# Patient Record
Sex: Female | Born: 1985 | Race: Black or African American | Hispanic: Refuse to answer | Marital: Single | Smoking: Former smoker
Health system: Southern US, Community
[De-identification: ages and names within clinical notes are randomized; demographics above are authoritative.]

## PROBLEM LIST (undated history)

## (undated) DIAGNOSIS — G43909 Migraine, unspecified, not intractable, without status migrainosus: Secondary | ICD-10-CM

## (undated) DIAGNOSIS — Z8489 Family history of other specified conditions: Secondary | ICD-10-CM

## (undated) DIAGNOSIS — K219 Gastro-esophageal reflux disease without esophagitis: Secondary | ICD-10-CM

## (undated) DIAGNOSIS — J45909 Unspecified asthma, uncomplicated: Secondary | ICD-10-CM

## (undated) DIAGNOSIS — F32A Depression, unspecified: Secondary | ICD-10-CM

## (undated) DIAGNOSIS — F329 Major depressive disorder, single episode, unspecified: Secondary | ICD-10-CM

## (undated) DIAGNOSIS — F419 Anxiety disorder, unspecified: Secondary | ICD-10-CM

## (undated) DIAGNOSIS — Z9189 Other specified personal risk factors, not elsewhere classified: Secondary | ICD-10-CM

## (undated) DIAGNOSIS — S060XAA Concussion with loss of consciousness status unknown, initial encounter: Secondary | ICD-10-CM

## (undated) DIAGNOSIS — Z803 Family history of malignant neoplasm of breast: Secondary | ICD-10-CM

## (undated) DIAGNOSIS — S060X9A Concussion with loss of consciousness of unspecified duration, initial encounter: Secondary | ICD-10-CM

## (undated) DIAGNOSIS — D649 Anemia, unspecified: Secondary | ICD-10-CM

## (undated) DIAGNOSIS — I10 Essential (primary) hypertension: Secondary | ICD-10-CM

## (undated) HISTORY — PX: TONSILLECTOMY: SHX5217

## (undated) HISTORY — DX: Family history of malignant neoplasm of breast: Z80.3

## (undated) HISTORY — PX: TONSILLECTOMY: SUR1361

## (undated) HISTORY — DX: Other specified personal risk factors, not elsewhere classified: Z91.89

## (undated) HISTORY — DX: Essential (primary) hypertension: I10

## (undated) HISTORY — PX: LIVER BIOPSY: SHX301

---

## 2008-09-30 ENCOUNTER — Emergency Department (HOSPITAL_COMMUNITY): Admission: EM | Admit: 2008-09-30 | Discharge: 2008-09-30 | Payer: Self-pay | Admitting: Emergency Medicine

## 2010-05-14 LAB — CBC
HCT: 43.3 % (ref 36.0–46.0)
MCHC: 33.3 g/dL (ref 30.0–36.0)
MCV: 89.1 fL (ref 78.0–100.0)
Platelets: 192 10*3/uL (ref 150–400)
RBC: 4.86 MIL/uL (ref 3.87–5.11)
WBC: 5.6 10*3/uL (ref 4.0–10.5)

## 2010-05-14 LAB — POCT PREGNANCY, URINE: Preg Test, Ur: NEGATIVE

## 2010-05-14 LAB — GC/CHLAMYDIA PROBE AMP, GENITAL
Chlamydia, DNA Probe: NEGATIVE
GC Probe Amp, Genital: NEGATIVE

## 2010-05-14 LAB — WET PREP, GENITAL
Trich, Wet Prep: NONE SEEN
Yeast Wet Prep HPF POC: NONE SEEN

## 2010-12-17 ENCOUNTER — Emergency Department: Payer: Self-pay | Admitting: Emergency Medicine

## 2011-01-06 ENCOUNTER — Emergency Department: Payer: Self-pay | Admitting: Emergency Medicine

## 2011-03-07 ENCOUNTER — Emergency Department: Payer: Self-pay | Admitting: Emergency Medicine

## 2011-07-21 ENCOUNTER — Emergency Department: Payer: Self-pay | Admitting: Emergency Medicine

## 2011-07-21 LAB — URINALYSIS, COMPLETE
Bilirubin,UR: NEGATIVE
RBC,UR: 6 /HPF (ref 0–5)
Specific Gravity: 1.017 (ref 1.003–1.030)
WBC UR: 32 /HPF (ref 0–5)

## 2012-04-17 ENCOUNTER — Emergency Department: Payer: Self-pay | Admitting: Emergency Medicine

## 2012-08-04 ENCOUNTER — Emergency Department: Payer: Self-pay | Admitting: Emergency Medicine

## 2012-08-04 LAB — URINALYSIS, COMPLETE
Bilirubin,UR: NEGATIVE
Glucose,UR: NEGATIVE mg/dL (ref 0–75)
Nitrite: POSITIVE
Ph: 6 (ref 4.5–8.0)
Specific Gravity: 1.011 (ref 1.003–1.030)
Squamous Epithelial: 3

## 2012-08-05 LAB — GC/CHLAMYDIA PROBE AMP

## 2012-12-10 ENCOUNTER — Emergency Department: Payer: Self-pay | Admitting: Emergency Medicine

## 2012-12-15 ENCOUNTER — Emergency Department: Payer: Self-pay | Admitting: Emergency Medicine

## 2012-12-15 LAB — COMPREHENSIVE METABOLIC PANEL
Alkaline Phosphatase: 91 U/L (ref 50–136)
BUN: 12 mg/dL (ref 7–18)
Bilirubin,Total: 0.2 mg/dL (ref 0.2–1.0)
Chloride: 105 mmol/L (ref 98–107)
Co2: 30 mmol/L (ref 21–32)
Creatinine: 0.85 mg/dL (ref 0.60–1.30)
Glucose: 101 mg/dL — ABNORMAL HIGH (ref 65–99)
Osmolality: 276 (ref 275–301)
SGOT(AST): 21 U/L (ref 15–37)

## 2012-12-15 LAB — CBC
HCT: 39.1 % (ref 35.0–47.0)
MCH: 29.9 pg (ref 26.0–34.0)
MCHC: 34.1 g/dL (ref 32.0–36.0)
RDW: 13.8 % (ref 11.5–14.5)
WBC: 8.9 10*3/uL (ref 3.6–11.0)

## 2013-02-19 ENCOUNTER — Emergency Department: Payer: Self-pay | Admitting: Internal Medicine

## 2013-03-07 ENCOUNTER — Emergency Department: Payer: Self-pay | Admitting: Emergency Medicine

## 2013-03-09 ENCOUNTER — Emergency Department: Payer: Self-pay

## 2013-05-08 ENCOUNTER — Emergency Department: Payer: Self-pay | Admitting: Emergency Medicine

## 2013-07-21 ENCOUNTER — Ambulatory Visit: Payer: Self-pay | Admitting: Orthopaedic Surgery

## 2013-09-04 ENCOUNTER — Emergency Department: Payer: Self-pay

## 2013-12-21 ENCOUNTER — Emergency Department: Payer: Self-pay | Admitting: Emergency Medicine

## 2014-04-12 ENCOUNTER — Emergency Department: Payer: Self-pay | Admitting: Emergency Medicine

## 2014-10-16 ENCOUNTER — Emergency Department: Payer: Self-pay

## 2014-10-16 ENCOUNTER — Encounter: Payer: Self-pay | Admitting: Emergency Medicine

## 2014-10-16 ENCOUNTER — Emergency Department
Admission: EM | Admit: 2014-10-16 | Discharge: 2014-10-16 | Disposition: A | Discharge status: Home / Self Care | Payer: Self-pay | Attending: Student | Admitting: Student

## 2014-10-16 DIAGNOSIS — M79605 Pain in left leg: Secondary | ICD-10-CM | POA: Insufficient documentation

## 2014-10-16 DIAGNOSIS — Z91040 Latex allergy status: Secondary | ICD-10-CM | POA: Insufficient documentation

## 2014-10-16 DIAGNOSIS — Z79899 Other long term (current) drug therapy: Secondary | ICD-10-CM | POA: Insufficient documentation

## 2014-10-16 DIAGNOSIS — Z720 Tobacco use: Secondary | ICD-10-CM | POA: Insufficient documentation

## 2014-10-16 HISTORY — DX: Migraine, unspecified, not intractable, without status migrainosus: G43.909

## 2014-10-16 MED ORDER — IBUPROFEN 800 MG PO TABS: 800.0000 mg | ORAL_TABLET | Freq: Three times a day (TID) | ORAL | Status: DC | PRN

## 2014-10-16 MED ORDER — CYCLOBENZAPRINE HCL 10 MG PO TABS: 10.0000 mg | ORAL_TABLET | Freq: Three times a day (TID) | ORAL | Status: DC | PRN
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
## 2014-10-16 NOTE — Discharge Instructions (Signed)
Patellofemoral Syndrome If you have had pain in the front of your knee for a long time, chances are good that you have patellofemoral syndrome. The word patella refers to the kneecap. Femoral (or femur) refers to the thigh bone. That is the bone the kneecap sits on. The kneecap is shaped like a triangle. Its job is to protect the knee and to improve the efficiency of your thigh muscles (quadriceps). The underside of the kneecap is made of smooth tissue (cartilage). This lets the kneecap slide up and down as the knee moves. Sometimes this cartilage becomes soft. Your healthcare provider may say the cartilage breaks down. That is patellofemoral syndrome. It can affect one knee, or both. The condition is sometimes called patellofemoral pain syndrome. That is because the condition is painful. The pain usually gets worse with activity. Sitting for a long time with the knee bent also makes the pain worse. It usually gets better with rest and proper treatment. CAUSES  No one is sure why some people develop this problem and others do not. Runners often get it. One name for the condition is "runner's knee." However, some people run for years and never have knee pain. Certain things seem to make patellofemoral syndrome more likely. They include:  Moving out of alignment. The kneecap is supposed to move in a straight line when the thigh muscle pulls on it. Sometimes the kneecap moves in poor alignment. That can make the knee swell and hurt. Some experts believe it also wears down the cartilage.  Injury to the kneecap.  Strain on the knee. This may occur during sports activity. Soccer, running, skiing and cycling can put excess stress on the knee.  Being flat-footed or knock-kneed. SYMPTOMS   Knee pain.  Pain under the kneecap. This is usually a dull, aching pain.  Pain in the knee when doing certain things: squatting, kneeling, going up or down stairs.  Pain in the knee when you stand up after sitting down  for awhile.  Tightness in the knee.  Loss of muscle strength in the thigh.  Swelling of the knee. DIAGNOSIS  Healthcare providers often send people with knee pain to an orthopedic caregiver. This person has special training to treat problems with bones and joints. To decide what is causing your knee pain, your caregiver will probably:  Do a physical exam. This will probably include:  Asking about symptoms you have noticed.  Asking about your activities and any injuries.  Feeling your knee. Moving it. This will help test the knee's strength. It will also check alignment (whether the knee and leg are aligned normally).  Order some tests, such as:  Imaging tests. They create pictures of the inside of the knee. Tests may include:  X-rays.  Computed tomography (CT) scan. This uses X-rays and a computer to show more detail.  Magnetic resonance imaging (MRI). This test uses magnets, radio waves and a computer to make pictures. TREATMENT   Medication is almost always used first. It can relieve pain. It also can reduce swelling. Non-steroidal anti-inflammatory medicines (called NSAIDs) are usually suggested. Sometimes a stronger form is needed. A stronger form would require a prescription.  Other treatment may be needed after the swelling goes down. Possibilities include:  Exercise. Certain exercises can make the muscles around the knee stronger which decreases the pressure on the knee cap. This includes the thigh muscle. Certain exercises also may be suggested to increase your flexibility.  A knee brace. This gives the knee extra support  and helps align the movement of the knee cap.  Orthotics. These are special shoe inserts. They can help keep your leg and knee aligned.  Surgery is sometimes needed. This is rare. Options include:  Arthroscopy. The surgeon uses a special tool to remove any damaged pieces of the kneecap. Only a few small incisions (cuts) are needed.  Realignment.  This is open surgery. The goals are to reduce pressure and fix the way the kneecap moves. HOME CARE INSTRUCTIONS   Take any medication prescribed by your healthcare provider. Follow the directions carefully.  If your knee is swollen:  Put ice or cold packs on it. Do this for 20 to 30 minutes, 3 to 4 times a day.  Keep the knee raised. Make sure it is supported. Put a pillow under it.  Rest your knee. For example, take the elevator instead of the stairs for awhile. Or, take a break from sports activity that strain your knee. Try walking or swimming instead.  Whenever you are active:  Use an elastic bandage on your knee. This gives it support.  After any activity, put ice or cold packs on your knees. Do this for about 10 to 20 minutes.  Make sure you wear shoes that give good support. Make sure they are not worn down. The heels should not slant in or out. SEEK MEDICAL CARE IF:   Knee pain gets worse. Or it does not go away, even after taking pain medicine.  Swelling does not go down.  Your thigh muscle becomes weak.  You have an oral temperature above 102 F (38.9 C). SEEK IMMEDIATE MEDICAL CARE IF:  You have an oral temperature above 102 F (38.9 C), not controlled by medicine. Document Released: 01/11/2009 Document Revised: 04/17/2011 Document Reviewed: 04/14/2013 [facility] Patient Information 2015 [place], [place]. This information is not intended to replace advice given to you by your health care provider. Make sure you discuss any questions you have with your health care provider.  Musculoskeletal Pain Musculoskeletal pain is muscle and boney aches and pains. These pains can occur in any part of the body. Your caregiver may treat you without knowing the cause of the pain. They may treat you if blood or urine tests, X-rays, and other tests were normal.  CAUSES There is often not a definite cause or reason for these pains. These pains may be caused by a type of germ (virus). The  discomfort may also come from overuse. Overuse includes working out too hard when your body is not fit. Boney aches also come from weather changes. Bone is sensitive to atmospheric pressure changes. HOME CARE INSTRUCTIONS   Ask when your test results will be ready. Make sure you get your test results.  Only take over-the-counter or prescription medicines for pain, discomfort, or fever as directed by your caregiver. If you were given medications for your condition, do not drive, operate machinery or power tools, or sign legal documents for 24 hours. Do not drink alcohol. Do not take sleeping pills or other medications that may interfere with treatment.  Continue all activities unless the activities cause more pain. When the pain lessens, slowly resume normal activities. Gradually increase the intensity and duration of the activities or exercise.  During periods of severe pain, bed rest may be helpful. Lay or sit in any position that is comfortable.  Putting ice on the injured area.  Put ice in a bag.  Place a towel between your skin and the bag.  Leave the ice  on for 15 to 20 minutes, 3 to 4 times a day.  Follow up with your caregiver for continued problems and no reason can be found for the pain. If the pain becomes worse or does not go away, it may be necessary to repeat tests or do additional testing. Your caregiver may need to look further for a possible cause. SEEK IMMEDIATE MEDICAL CARE IF:  You have pain that is getting worse and is not relieved by medications.  You develop chest pain that is associated with shortness or breath, sweating, feeling sick to your stomach (nauseous), or throw up (vomit).  Your pain becomes localized to the abdomen.  You develop any new symptoms that seem different or that concern you. MAKE SURE YOU:   Understand these instructions.  Will watch your condition.  Will get help right away if you are not doing well or get worse. Document Released:  01/23/2005 Document Revised: 04/17/2011 Document Reviewed: 09/27/2012 [facility] Patient Information 2015 [place], [place]. This information is not intended to replace advice given to you by your health care provider. Make sure you discuss any questions you have with your health care provider.
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
## 2015-11-09 DIAGNOSIS — F419 Anxiety disorder, unspecified: Secondary | ICD-10-CM | POA: Insufficient documentation

## 2015-11-09 DIAGNOSIS — Z5321 Procedure and treatment not carried out due to patient leaving prior to being seen by health care provider: Secondary | ICD-10-CM | POA: Insufficient documentation

## 2015-11-09 DIAGNOSIS — Z791 Long term (current) use of non-steroidal anti-inflammatories (NSAID): Secondary | ICD-10-CM | POA: Insufficient documentation

## 2015-11-09 DIAGNOSIS — F17200 Nicotine dependence, unspecified, uncomplicated: Secondary | ICD-10-CM | POA: Insufficient documentation

## 2015-11-10 ENCOUNTER — Encounter: Payer: Self-pay | Admitting: Emergency Medicine

## 2015-11-10 ENCOUNTER — Emergency Department
Admission: EM | Admit: 2015-11-10 | Discharge: 2015-11-10 | Disposition: A | Discharge status: Home / Self Care | Payer: Self-pay | Attending: Emergency Medicine | Admitting: Emergency Medicine

## 2015-11-10 LAB — URINALYSIS COMPLETE WITH MICROSCOPIC (ARMC ONLY)
BILIRUBIN URINE: NEGATIVE
GLUCOSE, UA: NEGATIVE mg/dL
KETONES UR: NEGATIVE mg/dL
LEUKOCYTES UA: NEGATIVE
Nitrite: NEGATIVE
PH: 6 (ref 5.0–8.0)
Protein, ur: NEGATIVE mg/dL
Specific Gravity, Urine: 1.017 (ref 1.005–1.030)

## 2015-11-10 LAB — URINE DRUG SCREEN, QUALITATIVE (ARMC ONLY)
AMPHETAMINES, UR SCREEN: NOT DETECTED
BARBITURATES, UR SCREEN: NOT DETECTED
BENZODIAZEPINE, UR SCRN: NOT DETECTED
COCAINE METABOLITE, UR ~~LOC~~: NOT DETECTED
Cannabinoid 50 Ng, Ur ~~LOC~~: POSITIVE — AB
MDMA (Ecstasy)Ur Screen: NOT DETECTED
METHADONE SCREEN, URINE: NOT DETECTED
OPIATE, UR SCREEN: NOT DETECTED
PHENCYCLIDINE (PCP) UR S: NOT DETECTED
Tricyclic, Ur Screen: NOT DETECTED

## 2015-11-10 LAB — POCT PREGNANCY, URINE: Preg Test, Ur: NEGATIVE

## 2015-11-10 NOTE — ED Notes (Signed)
Pt called to go back to exam room from lobby with no reply

## 2015-11-10 NOTE — ED Triage Notes (Addendum)
Patient ambulatory to triage with steady gait, without difficulty or distress noted; pt st "I haven't come down off my marijuana and I need ya'll to give me something to help"; reports having "anxiety"; admits to smoking at midnight and denies any other drug use; denies any other c/o

## 2015-12-19 ENCOUNTER — Emergency Department
Admission: EM | Admit: 2015-12-19 | Discharge: 2015-12-19 | Disposition: A | Discharge status: Home / Self Care | Payer: BLUE CROSS/BLUE SHIELD | Attending: Student in an Organized Health Care Education/Training Program | Admitting: Student in an Organized Health Care Education/Training Program

## 2015-12-19 ENCOUNTER — Emergency Department: Payer: BLUE CROSS/BLUE SHIELD

## 2015-12-19 DIAGNOSIS — S0003XA Contusion of scalp, initial encounter: Secondary | ICD-10-CM

## 2015-12-19 DIAGNOSIS — S169XXA Unspecified injury of muscle, fascia and tendon at neck level, initial encounter: Secondary | ICD-10-CM | POA: Diagnosis present

## 2015-12-19 DIAGNOSIS — Y92410 Unspecified street and highway as the place of occurrence of the external cause: Secondary | ICD-10-CM | POA: Insufficient documentation

## 2015-12-19 DIAGNOSIS — Z91040 Latex allergy status: Secondary | ICD-10-CM | POA: Diagnosis not present

## 2015-12-19 DIAGNOSIS — Y9389 Activity, other specified: Secondary | ICD-10-CM | POA: Diagnosis not present

## 2015-12-19 DIAGNOSIS — S161XXA Strain of muscle, fascia and tendon at neck level, initial encounter: Secondary | ICD-10-CM | POA: Diagnosis not present

## 2015-12-19 DIAGNOSIS — Y999 Unspecified external cause status: Secondary | ICD-10-CM | POA: Diagnosis not present

## 2015-12-19 DIAGNOSIS — F17200 Nicotine dependence, unspecified, uncomplicated: Secondary | ICD-10-CM | POA: Insufficient documentation

## 2015-12-19 MED ORDER — ACETAMINOPHEN 500 MG PO TABS
1000.0000 mg | ORAL_TABLET | Freq: Once | ORAL | Status: AC
  Administered 2015-12-19: 1000 mg via ORAL
  Filled 2015-12-19: qty 2

## 2015-12-19 MED ORDER — CYCLOBENZAPRINE HCL 5 MG PO TABS: 5.0000 mg | ORAL_TABLET | Freq: Three times a day (TID) | ORAL | 0 refills | Status: DC | PRN

## 2015-12-19 MED ORDER — IBUPROFEN 600 MG PO TABS: 600.0000 mg | ORAL_TABLET | Freq: Four times a day (QID) | ORAL | 0 refills | Status: DC | PRN
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
## 2015-12-19 NOTE — Discharge Instructions (Signed)
Please take ibuprofen, Flexeril as prescribed. Return to the ER for any worsening symptoms or urgent changes in her health such as severe headache, nausea, vomiting, increased neck pain. Follow-up with your primary care physician in 5-7 days if no improvement.
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
## 2016-01-26 ENCOUNTER — Other Ambulatory Visit: Payer: Self-pay | Admitting: Neurology

## 2016-01-26 DIAGNOSIS — G43019 Migraine without aura, intractable, without status migrainosus: Secondary | ICD-10-CM

## 2016-01-28 ENCOUNTER — Ambulatory Visit
Admission: RE | Admit: 2016-01-28 | Discharge: 2016-01-28 | Disposition: A | Discharge status: Home / Self Care | Payer: BLUE CROSS/BLUE SHIELD | Source: Ambulatory Visit | Attending: Neurology | Admitting: Neurology

## 2016-01-28 DIAGNOSIS — S060X0D Concussion without loss of consciousness, subsequent encounter: Secondary | ICD-10-CM | POA: Diagnosis present

## 2016-01-28 DIAGNOSIS — G43119 Migraine with aura, intractable, without status migrainosus: Secondary | ICD-10-CM | POA: Insufficient documentation

## 2016-01-28 DIAGNOSIS — G43019 Migraine without aura, intractable, without status migrainosus: Secondary | ICD-10-CM

## 2016-05-09 ENCOUNTER — Ambulatory Visit (INDEPENDENT_AMBULATORY_CARE_PROVIDER_SITE_OTHER): Payer: BLUE CROSS/BLUE SHIELD | Admitting: Obstetrics and Gynecology

## 2016-05-09 ENCOUNTER — Encounter: Payer: Self-pay | Admitting: Obstetrics and Gynecology

## 2016-05-09 DIAGNOSIS — Z01419 Encounter for gynecological examination (general) (routine) without abnormal findings: Secondary | ICD-10-CM | POA: Diagnosis not present

## 2016-05-09 DIAGNOSIS — Z124 Encounter for screening for malignant neoplasm of cervix: Secondary | ICD-10-CM

## 2016-05-09 DIAGNOSIS — D251 Intramural leiomyoma of uterus: Secondary | ICD-10-CM

## 2016-05-09 DIAGNOSIS — Z1231 Encounter for screening mammogram for malignant neoplasm of breast: Secondary | ICD-10-CM

## 2016-05-09 DIAGNOSIS — D252 Subserosal leiomyoma of uterus: Secondary | ICD-10-CM | POA: Diagnosis not present

## 2016-05-09 DIAGNOSIS — Z1239 Encounter for other screening for malignant neoplasm of breast: Secondary | ICD-10-CM

## 2016-05-09 NOTE — Patient Instructions (Signed)
BRCA Gene Testing Why am I having this test? BRCA gene testing is done to check for the presence of harmful changes (mutations) in the BRCA1 gene or the BRCA2 gene (breast cancer susceptibility genes). If there is a mutation, the genes may not be able to help repair damaged cells in the body. As a result, the damaged cells may develop defects that can lead to certain types of cancer. You may have this test if you have a family history of certain types of cancer, including cancer of the:  Breast.  Ovaries.  Fallopian tubes.  Peritoneum.  Pancreas.  Prostate. What kind of sample is taken? The test requires either a sample of blood or a sample of cells from your saliva. If a sample of blood is needed, it will probably be collected by inserting a needle into a vein. If a sample of saliva is needed, you will get instructions about how to collect the sample. What do the results mean? The test results can show whether you have a mutation in the BRCA1 or BRCA2 gene that increases your risk for certain cancers. Meaning of negative test results  A negative test result means that you do not have a mutation in the BRCA1 or BRCA2 gene that is known to increase your risk for certain cancers. This does not mean that you will never get cancer. Talk with your health care provider or a genetic counselor about what this result means for you. Meaning of positive test results  A positive test result means that you have a mutation in the BRCA1 or BRCA2 gene that increases your risk for certain cancers. Women with a positive test result have an increased risk for breast and ovarian cancer. Both women and men with a mutation have an increased risk for breast cancer and may be at greater risk for other types of cancer. Getting a positive test result does not mean that you will develop cancer. Talk with your health care provider or a genetic counselor about what this result means for you. You may be told that you are  a carrier. This means that you can pass the mutation to your children. Meaning of ambiguous test results  Ambiguous, inconclusive, or uncertain test results mean that there is a change in the BRCA1 or BRCA2 gene, but it is a change that has not been linked to cancer. Talk with your health care provider or a genetic counselor about what this result means for you. Talk with your health care provider to discuss your results, treatment options, and if necessary, the need for more tests. Talk with your health care provider if you have any questions about your results. How do I get my results? It is up to you to get your test results. Ask your health care provider, or the department that is doing the test, when your results will be ready. This information is not intended to replace advice given to you by your health care provider. Make sure you discuss any questions you have with your health care provider. Document Released: 02/17/2004 Document Revised: 09/27/2015 Document Reviewed: 09/15/2015 Elsevier Interactive Patient Education  2017 [employer].

## 2016-05-09 NOTE — Progress Notes (Signed)
Patient ID: Rachel Garrett, female   DOB: 13-Mar-1985, 31 y.o.   MRN: [id number]      Gynecology Annual Exam  PCP: [facility]  Chief Complaint:  Chief Complaint  Patient presents with  . Gynecologic Exam    Dx. w/HSV    History of Present Illness: Patient is a 31 y.o. G2P0010 presents for annual exam. The patient has no complaints today.   LMP: Patient's last menstrual period was 05/05/2016 (exact date). Average Interval: regular, 28 days Duration of flow: 5 days Heavy Menses: no Clots: no Intermenstrual Bleeding: no Postcoital Bleeding: no Dysmenorrhea: no  The patient is sexually active. She currently uses None for contraception. She denies dyspareunia.  The patient does perform self breast exams.  There is notable family history of breast or ovarian cancer in her family.  The patient wears seatbelts: yes.   The patient has regular exercise: not asked.    The patient denies current symptoms of depression.    Review of Systems: Review of Systems  Constitutional: Negative for chills and fever.  HENT: Negative for congestion.   Respiratory: Negative for cough and shortness of breath.   Cardiovascular: Negative for chest pain and palpitations.  Gastrointestinal: Negative for abdominal pain, constipation, diarrhea, heartburn, nausea and vomiting.  Genitourinary: Negative for dysuria, frequency and urgency.  Skin: Negative for itching and rash.  Neurological: Negative for dizziness and headaches.  Endo/Heme/Allergies: Negative for polydipsia.  Psychiatric/Behavioral: Negative for depression.    Past Medical History:  Past Medical History:  Diagnosis Date  . Hypertension   . Migraines     Past Surgical History:  History reviewed. No pertinent surgical history.  Gynecologic History:  Patient's last menstrual period was 05/05/2016 (exact date). Contraception: none Last Pap: Results were: no abnormalities 09/2014  Obstetric History:  G2P0010  Family History:  Family History  Problem Relation Age of Onset  . Breast cancer Maternal Aunt     [employer]  . Breast cancer Paternal Aunt 63  . Uterine cancer Maternal Aunt   . Breast cancer Paternal 34     Twin half sisters of dad    Social History:  Social History   Social History  . Marital status: Single    Spouse name: N/A  . Number of children: N/A  . Years of education: N/A   Occupational History  . Not on file.   Social History Main Topics  . Smoking status: Current Every Day Smoker  . Smokeless tobacco: Never Used  . Alcohol use No  . Drug use: No  . Sexual activity: Yes    Birth control/ protection: None   Other Topics Concern  . Not on file   Social History Narrative  . No narrative on file    Allergies:  Allergies  Allergen Reactions  . Benadryl [Diphenhydramine Hcl (Sleep)]   . Latex     Medications: Prior to Admission medications   Medication Sig Start Date End Date Taking? Authorizing Provider  citalopram (CELEXA) 10 MG tablet  02/29/16  Yes Historical Provider, MD  SUMAtriptan (IMITREX) 100 MG tablet  06/14/15  Yes Historical Provider, MD  albuterol (PROVENTIL HFA;VENTOLIN HFA) 108 (90 Base) MCG/ACT inhaler Inhale into the lungs.    Historical Provider, MD  clindamycin (CLEOCIN T) 1 % lotion  04/11/16   Historical Provider, MD  cyclobenzaprine (FLEXERIL) 10 MG tablet  02/29/16   Historical Provider, MD  cyclobenzaprine (FLEXERIL) 5 MG tablet Take 1-2 tablets (5-10 mg total) by mouth 3 (three)  times daily as needed for muscle spasms. 12/19/15   Duanne Guess, PA-C  hydrochlorothiazide (MICROZIDE) 12.5 MG capsule  02/29/16   Historical Provider, MD  ibuprofen (ADVIL,MOTRIN) 600 MG tablet Take 1 tablet (600 mg total) by mouth every 6 (six) hours as needed for moderate pain. 12/19/15   Duanne Guess, PA-C  ibuprofen (ADVIL,MOTRIN) 800 MG tablet  02/29/16   Historical Provider, MD  Multiple Vitamins-Minerals (MULTIVITAMIN ADULT PO) Take  by mouth.    Historical Provider, MD  Omega-3 Fatty Acids (FISH OIL CONCENTRATE PO) Take by mouth.    Historical Provider, MD  topiramate (TOPAMAX) 50 MG tablet  03/28/16   Historical Provider, MD  tretinoin (RETIN-A) 0.025 % cream  04/11/16   Historical Provider, MD    Physical Exam Vitals: Blood pressure 128/78, pulse (!) 106, height _0  (1.727 m), weight 184 lb (83.5 kg), last menstrual period 05/05/2016.  General: NAD HEENT: normocephalic, anicteric Thyroid: no enlargement, no palpable nodules Pulmonary: No increased work of breathing, CTAB Cardiovascular: RRR, distal pulses 2+ Breast: Breast symmetrical, no tenderness, no palpable nodules or masses, no skin or nipple retraction present, no nipple discharge.  No axillary or supraclavicular lymphadenopathy. Abdomen: NABS, soft, non-tender, non-distended.  Umbilicus without lesions.  No hepatomegaly, splenomegaly or masses palpable. No evidence of hernia  Genitourinary:  External: Normal external female genitalia.  Normal urethral meatus, normal  Bartholin's and Skene's glands.    Vagina: Normal vaginal mucosa, no evidence of prolapse.    Cervix: Grossly normal in appearance, no bleeding  Uterus: Non-enlarged, mobile, normal contour.  No CMT  Adnexa: ovaries non-enlarged, no adnexal masses  Rectal: deferred  Lymphatic: no evidence of inguinal lymphadenopathy Extremities: no edema, erythema, or tenderness Neurologic: Grossly intact Psychiatric: mood appropriate, affect full  Female chaperone present for pelvic and breast  portions of the physical exam    Assessment: 31 y.o. G2P0010 No problem-specific Assessment & Plan notes found for this encounter.   Plan: Problem List Items Addressed This Visit    None    Visit Diagnoses    Screening for malignant neoplasm of cervix       Relevant Orders   Pap IG w/ reflex to HPV when ASC-U   Breast screening       Encounter for gynecological examination without abnormal finding        Relevant Orders   Pap IG w/ reflex to HPV when ASC-U   Intramural and subserous leiomyoma of uterus       Relevant Orders   US Transvaginal Non-OB      1) STI screening was offered and declined  2) ASCCP guidelines and rational discussed.  Patient opts for yearly screening interval  3) Contraception - patient not currently contracepting is ok with pregnancy  4) Routine healthcare maintenance including cholesterol, diabetes screening discussed managed by PCP  5) Uterine fibroids - will obtain TVUS to evaluate size from last year within the next week, will call patient with results  6) Family history of ovarian cancer - two aunts (identical twins) with early onset breast cancer patient think under 29 with both deceased, given handout on myriad will consider testing at time of ultrasound  7) Follow up 1 year for routine annual exam

## 2016-05-10 LAB — PAP IG W/ RFLX HPV ASCU: PAP Smear Comment: 0

## 2016-05-12 ENCOUNTER — Encounter: Payer: Self-pay | Admitting: Obstetrics and Gynecology

## 2016-06-05 ENCOUNTER — Telehealth: Payer: Self-pay

## 2016-06-05 DIAGNOSIS — N852 Hypertrophy of uterus: Secondary | ICD-10-CM

## 2016-06-05 NOTE — Telephone Encounter (Signed)
Pt states she is scheduled for U/S 06/30/16 & she has to work that day. Pt asking if possible for order to be sent to [facility] so she can get the U/S after work? [phone] Ext [phone]

## 2016-06-09 NOTE — Telephone Encounter (Signed)
Lmtrc

## 2016-06-09 NOTE — Telephone Encounter (Signed)
Order is in.

## 2016-06-12 NOTE — Telephone Encounter (Signed)
Per Barnett Applebaum, pt returned call & is scheduled for 06/15/16 @ 1:30

## 2016-06-15 ENCOUNTER — Ambulatory Visit: Payer: BLUE CROSS/BLUE SHIELD

## 2016-06-19 ENCOUNTER — Ambulatory Visit
Admission: RE | Admit: 2016-06-19 | Discharge: 2016-06-19 | Disposition: A | Discharge status: Home / Self Care | Payer: BLUE CROSS/BLUE SHIELD | Source: Ambulatory Visit | Attending: Obstetrics and Gynecology | Admitting: Obstetrics and Gynecology

## 2016-06-19 DIAGNOSIS — N83201 Unspecified ovarian cyst, right side: Secondary | ICD-10-CM | POA: Insufficient documentation

## 2016-06-19 DIAGNOSIS — N852 Hypertrophy of uterus: Secondary | ICD-10-CM | POA: Diagnosis present

## 2016-06-19 DIAGNOSIS — D259 Leiomyoma of uterus, unspecified: Secondary | ICD-10-CM | POA: Insufficient documentation

## 2016-06-20 ENCOUNTER — Encounter: Payer: Self-pay | Admitting: Obstetrics and Gynecology

## 2016-06-26 ENCOUNTER — Other Ambulatory Visit: Payer: Self-pay | Admitting: Obstetrics and Gynecology

## 2016-06-26 ENCOUNTER — Telehealth: Payer: Self-pay | Admitting: Obstetrics and Gynecology

## 2016-06-26 DIAGNOSIS — N83209 Unspecified ovarian cyst, unspecified side: Secondary | ICD-10-CM

## 2016-06-26 NOTE — Telephone Encounter (Signed)
Needs ultrasound in the next 6 weeks order should be in

## 2016-06-26 NOTE — Telephone Encounter (Signed)
Pt is schedule 08/07/16

## 2016-06-26 NOTE — Telephone Encounter (Signed)
Needs ultrasound in the next 6 weeks order should be in    ----- Message -----  From: Quintella Baton, CMA  Sent: 06/26/2016  8:04 AM  To: Malachy Mood, MD  Subject: FW: RE: FW: RE: RE: Ultrasound     Called pt unable to reach pt due to voicemail not set up. Please schedule when pt call back

## 2016-06-30 ENCOUNTER — Other Ambulatory Visit: Payer: BLUE CROSS/BLUE SHIELD

## 2016-08-07 ENCOUNTER — Telehealth: Payer: Self-pay

## 2016-08-07 ENCOUNTER — Other Ambulatory Visit: Payer: Self-pay | Admitting: Obstetrics and Gynecology

## 2016-08-07 ENCOUNTER — Ambulatory Visit: Payer: BLUE CROSS/BLUE SHIELD | Admitting: Obstetrics and Gynecology

## 2016-08-07 ENCOUNTER — Other Ambulatory Visit: Payer: BLUE CROSS/BLUE SHIELD

## 2016-08-07 DIAGNOSIS — N83201 Unspecified ovarian cyst, right side: Secondary | ICD-10-CM | POA: Insufficient documentation

## 2016-08-07 NOTE — Telephone Encounter (Signed)
Pt has appt this pm but has to work and is unable to come.  Pt would like AMS to do her u/s like he did 6wks ago - he put in the order, she went to the [facility], she had the u/s done, he told her the results via [place].  [phone] ext [phone]

## 2016-08-07 NOTE — Telephone Encounter (Signed)
Order is in not sure if they call her or she needs to call to schedule

## 2016-08-07 NOTE — Telephone Encounter (Signed)
Please advise 

## 2016-08-08 NOTE — Telephone Encounter (Signed)
Patient is aware of Friday, 08/11/16 @ 4:30pm appointment at [facility]. Patient is aware to arrive @ 4:15pm at the [facility] with a full bladder.

## 2016-08-11 ENCOUNTER — Ambulatory Visit: Payer: BLUE CROSS/BLUE SHIELD

## 2016-08-16 ENCOUNTER — Ambulatory Visit: Payer: BLUE CROSS/BLUE SHIELD

## 2016-08-17 ENCOUNTER — Encounter: Payer: Self-pay | Admitting: Obstetrics and Gynecology

## 2016-08-17 NOTE — Telephone Encounter (Signed)
I've moved this appointment several times because she wanted to have it done at the outpatient imaging as opposed to at [facility].  She needs an ultrasound appointment and follow up after the ultrasound whenever we have an opening for those

## 2016-08-24 ENCOUNTER — Ambulatory Visit
Admission: RE | Admit: 2016-08-24 | Discharge: 2016-08-24 | Disposition: A | Discharge status: Home / Self Care | Payer: BLUE CROSS/BLUE SHIELD | Source: Ambulatory Visit | Attending: Obstetrics and Gynecology | Admitting: Obstetrics and Gynecology

## 2016-08-24 DIAGNOSIS — N83201 Unspecified ovarian cyst, right side: Secondary | ICD-10-CM | POA: Diagnosis not present

## 2016-08-24 DIAGNOSIS — D259 Leiomyoma of uterus, unspecified: Secondary | ICD-10-CM | POA: Diagnosis not present

## 2016-08-25 ENCOUNTER — Encounter: Payer: Self-pay | Admitting: Obstetrics and Gynecology

## 2016-08-30 ENCOUNTER — Encounter: Payer: Self-pay | Admitting: Obstetrics and Gynecology

## 2016-08-31 ENCOUNTER — Encounter: Payer: Self-pay | Admitting: Emergency Medicine

## 2016-08-31 ENCOUNTER — Emergency Department
Admission: EM | Admit: 2016-08-31 | Discharge: 2016-08-31 | Disposition: A | Discharge status: Home / Self Care | Payer: BLUE CROSS/BLUE SHIELD | Attending: Emergency Medicine | Admitting: Emergency Medicine

## 2016-08-31 DIAGNOSIS — I10 Essential (primary) hypertension: Secondary | ICD-10-CM | POA: Insufficient documentation

## 2016-08-31 DIAGNOSIS — Z91040 Latex allergy status: Secondary | ICD-10-CM | POA: Diagnosis not present

## 2016-08-31 DIAGNOSIS — Z79899 Other long term (current) drug therapy: Secondary | ICD-10-CM | POA: Insufficient documentation

## 2016-08-31 DIAGNOSIS — T7840XA Allergy, unspecified, initial encounter: Secondary | ICD-10-CM

## 2016-08-31 DIAGNOSIS — R221 Localized swelling, mass and lump, neck: Secondary | ICD-10-CM

## 2016-08-31 DIAGNOSIS — F17200 Nicotine dependence, unspecified, uncomplicated: Secondary | ICD-10-CM | POA: Diagnosis not present

## 2016-08-31 MED ORDER — SODIUM CHLORIDE 0.9 % IV BOLUS (SEPSIS)
1000.0000 mL | Freq: Once | INTRAVENOUS | Status: AC
  Administered 2016-08-31: 1000 mL via INTRAVENOUS

## 2016-08-31 MED ORDER — PREDNISONE 20 MG PO TABS
60.0000 mg | ORAL_TABLET | Freq: Once | ORAL | Status: AC
  Administered 2016-08-31: 60 mg via ORAL
  Filled 2016-08-31: qty 3

## 2016-08-31 MED ORDER — HYDROXYZINE HCL 25 MG PO TABS
25.0000 mg | ORAL_TABLET | Freq: Once | ORAL | Status: AC
  Administered 2016-08-31: 25 mg via ORAL
  Filled 2016-08-31: qty 1

## 2016-08-31 MED ORDER — HYDROXYZINE PAMOATE 50 MG PO CAPS: 50.0000 mg | ORAL_CAPSULE | Freq: Three times a day (TID) | ORAL | 0 refills | Status: DC | PRN

## 2016-08-31 NOTE — ED Provider Notes (Signed)
[facility] Emergency Department Provider Note   ____________________________________________   First MD Initiated Contact with Patient 08/31/16 0602     (approximate)  I have reviewed the triage vital signs and the nursing notes.   HISTORY  Chief Complaint Allergic Reaction    HPI Rachel Garrett is a 31 y.o. female who comes into the hospital today with a possible allergic reaction. The patient reports that she went to urgent care on Sunday and was given tramadol. She reports that after taking the medication she was itching really bad. She kept taking the medication and Itching until she asked provider at her job on Wednesday and that was normal and they said it was not. She tried to go back to the urgent care but they didn't have any appointments available so she went to a different urgent care and was given a steroid shot with a prescription for prednisone and told to take Zyrtec. She was also told that citalopram and tramadol should never be taken together. She reports that she was doing okay until this morning when her throat started to feel funny. She reports that while he doesn't feel like it is distinctly swelling it feels very heavy like she feels whenever she takes Imitrex. She reports that she laid in bed and tried to sleep but was concerned that something was wrong. She reports that she did not take her Imitrex as she didn't understand why she was having these symptoms. The patient denies any shortness of breath or itchiness. Her itchiness has improved. She did have some hives on her elbow but that is also improving. She took a Zyrtec and then decided to come into the hospital for evaluation.   Past Medical History:  Diagnosis Date  . Hypertension   . Migraines     Patient Active Problem List   Diagnosis Date Noted  . Right ovarian cyst 08/07/2016    History reviewed. No pertinent surgical history.  Prior to Admission medications     Medication Sig Start Date End Date Taking? Authorizing Provider  albuterol (PROVENTIL HFA;VENTOLIN HFA) 108 (90 Base) MCG/ACT inhaler Inhale into the lungs.    [provider]  citalopram (CELEXA) 10 MG tablet  02/29/16   [provider]  clindamycin (CLEOCIN T) 1 % lotion  04/11/16   [provider]  cyclobenzaprine (FLEXERIL) 10 MG tablet  02/29/16   [provider]  cyclobenzaprine (FLEXERIL) 5 MG tablet Take 1-2 tablets (5-10 mg total) by mouth 3 (three) times daily as needed for muscle spasms. 12/19/15   Duanne Guess, PA-C  hydrochlorothiazide (MICROZIDE) 12.5 MG capsule  02/29/16   [provider]  ibuprofen (ADVIL,MOTRIN) 600 MG tablet Take 1 tablet (600 mg total) by mouth every 6 (six) hours as needed for moderate pain. 12/19/15   Duanne Guess, PA-C  ibuprofen (ADVIL,MOTRIN) 800 MG tablet  02/29/16   [provider]  Multiple Vitamins-Minerals (MULTIVITAMIN ADULT PO) Take by mouth.    [provider]  Omega-3 Fatty Acids (FISH OIL CONCENTRATE PO) Take by mouth.    [provider]  SUMAtriptan (IMITREX) 100 MG tablet  06/14/15   [provider]  topiramate (TOPAMAX) 50 MG tablet  03/28/16   [provider]  tretinoin (RETIN-A) 0.025 % cream  04/11/16   [provider]    Allergies Benadryl [diphenhydramine hcl (sleep)]; Latex; and Ultram [tramadol hcl]  Family History  Problem Relation Age of Onset  . Breast cancer Maternal Aunt  [employer]  . Breast cancer Paternal Aunt 29  . Uterine cancer Maternal Aunt   . Breast cancer Paternal 72        Twin half sisters of dad    Social History Social History  Substance Use Topics  . Smoking status: Current Every Day Smoker  . Smokeless tobacco: Never Used  . Alcohol use No    Review of Systems  Constitutional: No fever/chills Eyes: No visual changes. ENT: fullness in back of throat Cardiovascular: Denies chest  pain. Respiratory: Denies shortness of breath. Gastrointestinal: No abdominal pain.  No nausea, no vomiting.  No diarrhea.  No constipation. Genitourinary: Negative for dysuria. Musculoskeletal: Negative for back pain. Skin: itching Neurological: Negative for headaches, focal weakness or numbness.   ____________________________________________   PHYSICAL EXAM:  VITAL SIGNS: ED Triage Vitals  Enc Vitals Group     BP 08/31/16 0552 (!) 122/92     Pulse Rate 08/31/16 0552 (!) 131     Resp 08/31/16 0552 18     Temp --      Temp src --      SpO2 08/31/16 0552 100 %     Weight --      Height --      Head Circumference --      Peak Flow --      Pain Score 08/31/16 0602 0     Pain Loc --      Pain Edu? --      Excl. in [place]? --     Constitutional: Alert and oriented. Well appearing and in mild distress. Eyes: Conjunctivae are normal. PERRL. EOMI. Head: Atraumatic. Nose: No congestion/rhinnorhea. Mouth/Throat: Mucous membranes are moist.  Oropharynx non-erythematous. No visible swelling to the posterior oropharynx Cardiovascular: Tachycardia, regular rhythm. Grossly normal heart sounds.  Good peripheral circulation. Respiratory: Normal respiratory effort.  No retractions. Lungs CTAB. Gastrointestinal: Soft and nontender. No distention. Positive bowel sounds Musculoskeletal: No lower extremity tenderness nor edema.   Neurologic:  Normal speech and language.  Skin:  Skin is warm, dry and intact.  Psychiatric: Mood and affect are normal.   ____________________________________________   LABS (all labs ordered are listed, but only abnormal results are displayed)  Labs Reviewed - No data to display ____________________________________________  EKG  none ____________________________________________  RADIOLOGY  No results found.  ____________________________________________   PROCEDURES  Procedure(s) performed: None  Procedures  Critical Care performed:  No  ____________________________________________   INITIAL IMPRESSION / ASSESSMENT AND PLAN / ED COURSE  Pertinent labs & imaging results that were available during my care of the patient were reviewed by me and considered in my medical decision making (see chart for details).  This is a 31 year old female who comes into the hospital today with some fullness in the back of her throat. The patient is also tachycardic. She does not have any shortness of breath but is concerned she may be having an allergic reaction. The patient is allergic to Benadryl and she received a shot of steroid earlier today. I will give the patient dose of prednisone as well as some Vistaril and a liter of normal saline. She is well and this time I will reassess the patient when she's received all of her medications.  The patient's care will be signed out to Dr. Mable Paris who will reassess the patient and determine her appropriate disposition.       ____________________________________________   FINAL CLINICAL IMPRESSION(S) / ED DIAGNOSES  Final diagnoses:  Allergic reaction, initial encounter  Throat swelling  NEW MEDICATIONS STARTED DURING THIS VISIT:  New Prescriptions   No medications on file     Note:  This document was prepared using Dragon voice recognition software and may include unintentional dictation errors.    Loney Hering, MD 08/31/16 [phone]

## 2016-08-31 NOTE — ED Triage Notes (Signed)
Patient took tramadol 9am yesterday morning and states she feels like her throat feels tight.

## 2016-08-31 NOTE — ED Notes (Signed)
Pt alert and oriented. NAD. Respirations unlabored. Skin warm and dry. Reports symptoms are improving.

## 2016-08-31 NOTE — ED Provider Notes (Signed)
Shortness of breath resolved. Her tachycardia resolved. Now able to drink water without difficulty. Stable for discharge.   Darel Hong, MD 08/31/16 [phone]

## 2016-08-31 NOTE — Discharge Instructions (Signed)
Please completed a course of steroids and return to the emergency department for any concerns.

## 2016-09-01 ENCOUNTER — Ambulatory Visit: Payer: BLUE CROSS/BLUE SHIELD | Admitting: Obstetrics and Gynecology

## 2016-09-12 ENCOUNTER — Other Ambulatory Visit: Payer: Self-pay | Admitting: Obstetrics and Gynecology

## 2016-09-12 DIAGNOSIS — N83201 Unspecified ovarian cyst, right side: Secondary | ICD-10-CM

## 2016-09-26 ENCOUNTER — Encounter: Payer: Self-pay | Admitting: Obstetrics and Gynecology

## 2016-09-27 ENCOUNTER — Telehealth: Payer: Self-pay | Admitting: Obstetrics and Gynecology

## 2016-09-27 NOTE — Telephone Encounter (Signed)
No answer, vm not set up.

## 2016-09-27 NOTE — Telephone Encounter (Signed)
-----   Message from Malachy Mood, MD sent at 09/26/2016  5:19 PM EDT ----- Regarding: Surgery Surgery Date: 4-12 weeks  LOS: inpatient  Surgery Booking Request Patient Full Name: Rachel Garrett MRN: [id number]  DOB: 06-09-1985  Surgeon: Malachy Mood, MD  Requested Surgery Date and Time: 4-12 weeks Primary Diagnosis and Code: Uterine fibroids Secondary Diagnosis and Code: Ovarian endometrioma Surgical Procedure: Open myomectomy (laparotomy) and ovarian cystectomy L&D Notification:N/A Admission Status: observation Length of Surgery: 2hrs Special Case Needs: none H&P:  (date) Phone Interview or Office Pre-Admit: pre-admit Interpreter: none Language: english Medical Clearance: none Special Scheduling Instructions: none

## 2016-09-28 NOTE — Telephone Encounter (Signed)
I spoke to the patient to discuss upcoming available OR dates. The patient would like to know her expected recovery time before scheduling. I will discuss w/ AMS in the morning and call patient to schedule.

## 2016-09-29 NOTE — Telephone Encounter (Signed)
No answer, vm not set up.

## 2016-09-29 NOTE — Telephone Encounter (Signed)
Per Dr.Staebler, the patient's expected recovery is 6 wks. No answer, v/m not set up.

## 2016-10-03 NOTE — Telephone Encounter (Signed)
No answer, vm not set up.

## 2016-10-03 NOTE — Telephone Encounter (Signed)
Patient returned call and scheduled H&P at [facility] on 10/23/16 @ 8:30am w/ Pre-admit Testing following, and OR on 11/02/16.

## 2016-10-05 ENCOUNTER — Encounter: Payer: Self-pay | Admitting: Obstetrics and Gynecology

## 2016-10-11 ENCOUNTER — Encounter: Payer: Self-pay | Admitting: Obstetrics and Gynecology

## 2016-10-11 NOTE — Telephone Encounter (Signed)
I've put a letter in her chart if we can mail or fax it

## 2016-10-17 ENCOUNTER — Telehealth: Payer: Self-pay

## 2016-10-17 NOTE — Telephone Encounter (Signed)
FMLA/DISABILITY form for [employer] filled out and given to [place] for processing.

## 2016-10-19 ENCOUNTER — Encounter: Payer: Self-pay | Admitting: Obstetrics and Gynecology

## 2016-10-23 ENCOUNTER — Ambulatory Visit (INDEPENDENT_AMBULATORY_CARE_PROVIDER_SITE_OTHER): Payer: BLUE CROSS/BLUE SHIELD | Admitting: Obstetrics and Gynecology

## 2016-10-23 ENCOUNTER — Encounter
Admission: RE | Admit: 2016-10-23 | Discharge: 2016-10-23 | Disposition: A | Discharge status: Home / Self Care | Payer: BLUE CROSS/BLUE SHIELD | Source: Ambulatory Visit | Attending: Obstetrics and Gynecology | Admitting: Obstetrics and Gynecology

## 2016-10-23 ENCOUNTER — Encounter: Payer: Self-pay | Admitting: Obstetrics and Gynecology

## 2016-10-23 VITALS — BP 104/72 | HR 109 | Ht 67.0 in | Wt 177.0 lb

## 2016-10-23 DIAGNOSIS — D252 Subserosal leiomyoma of uterus: Secondary | ICD-10-CM

## 2016-10-23 DIAGNOSIS — R102 Pelvic and perineal pain: Secondary | ICD-10-CM

## 2016-10-23 DIAGNOSIS — Z01812 Encounter for preprocedural laboratory examination: Secondary | ICD-10-CM | POA: Diagnosis not present

## 2016-10-23 DIAGNOSIS — I10 Essential (primary) hypertension: Secondary | ICD-10-CM | POA: Insufficient documentation

## 2016-10-23 DIAGNOSIS — N83201 Unspecified ovarian cyst, right side: Secondary | ICD-10-CM | POA: Insufficient documentation

## 2016-10-23 DIAGNOSIS — J45909 Unspecified asthma, uncomplicated: Secondary | ICD-10-CM | POA: Insufficient documentation

## 2016-10-23 DIAGNOSIS — Z01810 Encounter for preprocedural cardiovascular examination: Secondary | ICD-10-CM | POA: Diagnosis present

## 2016-10-23 DIAGNOSIS — F329 Major depressive disorder, single episode, unspecified: Secondary | ICD-10-CM | POA: Diagnosis not present

## 2016-10-23 DIAGNOSIS — D251 Intramural leiomyoma of uterus: Secondary | ICD-10-CM

## 2016-10-23 DIAGNOSIS — F419 Anxiety disorder, unspecified: Secondary | ICD-10-CM | POA: Diagnosis not present

## 2016-10-23 HISTORY — DX: Major depressive disorder, single episode, unspecified: F32.9

## 2016-10-23 HISTORY — DX: Concussion with loss of consciousness of unspecified duration, initial encounter: S06.0X9A

## 2016-10-23 HISTORY — DX: Unspecified asthma, uncomplicated: J45.909

## 2016-10-23 HISTORY — DX: Depression, unspecified: F32.A

## 2016-10-23 HISTORY — DX: Concussion with loss of consciousness status unknown, initial encounter: S06.0XAA

## 2016-10-23 HISTORY — DX: Anxiety disorder, unspecified: F41.9

## 2016-10-23 LAB — CBC
HCT: 33.5 % — ABNORMAL LOW (ref 35.0–47.0)
HEMOGLOBIN: 10.7 g/dL — AB (ref 12.0–16.0)
MCH: 23.7 pg — ABNORMAL LOW (ref 26.0–34.0)
MCHC: 32.1 g/dL (ref 32.0–36.0)
MCV: 74 fL — ABNORMAL LOW (ref 80.0–100.0)
Platelets: 235 10*3/uL (ref 150–440)
RBC: 4.53 MIL/uL (ref 3.80–5.20)
RDW: 19.9 % — AB (ref 11.5–14.5)
WBC: 8.5 10*3/uL (ref 3.6–11.0)

## 2016-10-23 LAB — BASIC METABOLIC PANEL
ANION GAP: 8 (ref 5–15)
BUN: 12 mg/dL (ref 6–20)
CALCIUM: 8.9 mg/dL (ref 8.9–10.3)
CO2: 24 mmol/L (ref 22–32)
Chloride: 105 mmol/L (ref 101–111)
Creatinine, Ser: 0.85 mg/dL (ref 0.44–1.00)
GFR calc Af Amer: >60 mL/min (ref >60)
GFR calc non Af Amer: >60 mL/min (ref >60)
GLUCOSE: 77 mg/dL (ref 65–99)
Potassium: 3.4 mmol/L — ABNORMAL LOW (ref 3.5–5.1)
Sodium: 137 mmol/L (ref 135–145)

## 2016-10-23 LAB — TYPE AND SCREEN
ABO/RH(D): O POS
ANTIBODY SCREEN: NEGATIVE

## 2016-10-23 NOTE — Patient Instructions (Signed)
Your procedure is scheduled on: Thursday 11/02/16 Report to [place]. 2ND FLOOR MEDICAL MALL ENTRANCE. To find out your arrival time please call [phone] between 1PM - 3PM on Wednesday 11/01/16.  Remember: Instructions that are not followed completely may result in serious medical risk, up to and including death, or upon the discretion of your surgeon and anesthesiologist your surgery may need to be rescheduled.    __X__ 1. Do not eat anything after midnight the night before your    procedure.  No gum chewing or hard candies.  You may drink clear   liquids up to 2 hours before you are scheduled to arrive at the   hospital for your procedure. Do not drink clear liquids within 2   hours of scheduled arrival to the hospital as this may lead to your   procedure being delayed or rescheduled.       Clear liquids include:   Water or Apple juice without pulp   Clear carbohydrate beverage such as Clearfast or Gatorade   Black coffee or Clear Tea (no milk, no creamer, do not add anything   to the coffee or tea)    Diabetics should only drink water   __X__ 2. No Alcohol for 24 hours before or after surgery.   ____ 3. Bring all medications with you on the day of surgery if instructed.    __X__ 4. Notify your doctor if there is any change in your medical condition     (cold, fever, infections).             __X___5. No smoking within 24 hours of your surgery.     Do not wear jewelry, make-up, hairpins, clips or nail polish.  Do not wear lotions, powders, or perfumes.   Do not shave 48 hours prior to surgery. Men may shave face and neck.  Do not bring valuables to the hospital.    [facility] is not responsible for any belongings or valuables.               Contacts, dentures or bridgework may not be worn into surgery.  Leave your suitcase in the car. After surgery it may be brought to your room.  For patients admitted to the hospital, discharge time is determined by your                 treatment team.   Patients discharged the day of surgery will not be allowed to drive home.   Please read over the following fact sheets that you were given:   MRSA Information   __X__ Take these medicines the morning of surgery with A SIP OF WATER:    1. CITALOPRAM  2. TOPIRAMATE  3.   4.  5.  6.  ____ Fleet Enema (as directed)   __X__ Use CHG Soap/SAGE wipes as directed  __X__ Use inhalers on the day of surgery  ____ Stop metformin 2 days prior to surgery    ____ Take 1/2 of usual insulin dose the night before surgery and none on the morning of surgery.   ____ Stop Coumadin/Plavix/aspirin on   __X__ Stop Anti-inflammatories such as Advil, Aleve, Ibuprofen, Motrin, Naproxen, Naprosyn, Goodies,powder, or aspirin products EXCEDRIN.  OK to take Tylenol.   __X__ Stop supplements, Vitamin E, Fish Oil until after surgery.  OLIVE LEAF  ____ Bring C-Pap to the hospital.

## 2016-10-23 NOTE — Progress Notes (Signed)
Obstetrics & Gynecology Surgery H&P    Chief Complaint: Scheduled Surgery   History of Present Illness: Patient is a 31 y.o. G2P0020 presenting for scheduled laparotomy, myomectomy, right ovarian cystectomy, for the treatment or further evaluation of uterine fibroids and right endometrioma   Prior Treatments prior to proceeding with surgery include: OCP and hormonal management  Preoperative Pap: 05/09/16 Results: no abnormalities  Preoperative Endometrial biopsy: N/A Preoperative Ultrasound: 08/24/16 Findings: Two fundal fibroids 5.5 x 5.1 x 4.0cm, 2.5 x 2.3 x 2.7cm, this posterior uterine fibroid 3.4 x 2.8 x 3.4cm, right ovary 3.7 x 3.2 x 2.7cm cyst   Review of Systems:10 point review of systems  Past Medical History:  Past Medical History:  Diagnosis Date  . Hypertension   . Migraines     Past Surgical History:  Past Surgical History:  Procedure Laterality Date  . 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 TONSILLECTOMY      Family History:  Family History  Problem Relation Age of Onset  . Breast cancer Maternal Aunt        [employer]  . Breast cancer Paternal Aunt 65  . Uterine cancer Maternal Aunt   . Breast cancer Paternal 7        Twin half sisters of dad    Social History:  Social History   Social History  . Marital status: Single    Spouse name: N/A  . Number of children: N/A  . Years of education: N/A   Occupational History  . Not on file.   Social History Main Topics  . Smoking status: Current Every Day Smoker  . Smokeless tobacco: Never Used  . Alcohol use No  . Drug use: No  . Sexual activity: Yes    Birth control/ protection: None   Other Topics Concern  . Not on file   Social History Narrative  . No narrative on file    Allergies:  Allergies  Allergen Reactions  . Benadryl [Diphenhydramine Hcl (Sleep)]   . Latex   . Ultram [Tramadol Hcl]     Medications: Prior to Admission medications   Medication Sig Start Date End Date Taking?  Authorizing Provider  albuterol (PROVENTIL HFA;VENTOLIN HFA) 108 (90 Base) MCG/ACT inhaler Inhale 2 puffs into the lungs every 6 (six) hours as needed (for wheezing/shortness of breath).    Yes [provider]  aspirin-acetaminophen-caffeine (EXCEDRIN MIGRAINE) [id number] MG tablet Take 1-2 tablets by mouth every 8 (eight) hours as needed for migraine.   Yes [provider]  citalopram (CELEXA) 20 MG tablet Take 20 mg by mouth daily with breakfast.   Yes [provider]  cyclobenzaprine (FLEXERIL) 10 MG tablet Take 10 mg by mouth 3 (three) times daily as needed (for muscle spasms).  02/29/16  Yes [provider]  hydrochlorothiazide (MICROZIDE) 12.5 MG capsule Take 12.5 mg by mouth daily with breakfast.  02/29/16  Yes [provider]  OLIVE LEAF PO Take 750 mg by mouth 4 (four) times daily.   Yes [provider]  SUMAtriptan (IMITREX) 100 MG tablet Take 100 mg by mouth every 2 (two) hours as needed (for migraine headaches (2 doses/24 hr)).  06/14/15  Yes [provider]  topiramate (TOPAMAX) 50 MG tablet Take 50 mg by mouth daily with breakfast.  03/28/16  Yes [provider]    Physical Exam Vitals: Blood pressure 104/72, pulse (!) 109, height 5\' 7"  (1.702 m), weight 177 lb (80.3 kg), last menstrual period 09/28/2016. General: NAD HEENT: normocephalic,  anicteric Pulmonary: CTAB, No increased work of breathing Cardiovascular: RRR, distal pulses 2+ Abdomen: Soft, non-tender, non-distended Genitourinary: deferred Extremities: no edema, erythema, or tenderness Neurologic: Grossly intact Psychiatric: mood appropriate, affect full  Imaging No results found.  Assessment: 31 y.o. G2P0020 presenting for scheduled laparotomy, myomectomy, right ovarian cystectomy  Plan: 1) I have had a careful discussion with this patient about all the options available and the risk/benefits of each. I have fully informed this patient that a  laparotomy  may subject her to a variety of discomforts and risks: She understands that most patients have surgery with little difficulty, but problems can happen ranging from minor to fatal. These include nausea, vomiting, pain, bleeding, infection, poor healing, hernia, or formation of adhesions. Unexpected reactions may occur from any drug or anesthetic given. Unintended injury may occur to other pelvic or abdominal structures such as Fallopian tubes, ovaries, bladder, ureter (tube from kidney to bladder), or bowel. Nerves going from the pelvis to the legs may be injured. Any such injury may require immediate or later additional surgery to correct the problem. Excessive blood loss requiring transfusion is very unlikely but possible. Dangerous blood clots may form in the legs or lungs. Physical and sexual activity will be restricted in varying degrees for an indeterminate period of time but most often6 weeks.  Finally, she understands that it is impossible to list every possible undesirable effect and that the condition for which surgery is done is not always cured or significantly improved, and in rare cases may be even worsen. Ample time was given to answer all questions.  2) Routine postoperative instructions were reviewed with the patient and her family in detail today including the expected length of recovery and likely postoperative course.  The patient concurred with the proposed plan, giving informed written consent for the surgery today.  Patient instructed on the importance of being NPO after midnight prior to her procedure.  If warranted preoperative prophylactic antibiotics and SCDs ordered on call to the OR to meet SCIP guidelines and adhere to recommendation laid forth in [place] Number 104 May 2009  "Antibiotic Prophylaxis for Gynecologic Procedures".

## 2016-10-27 ENCOUNTER — Encounter: Payer: Self-pay | Admitting: Obstetrics and Gynecology

## 2016-11-01 MED ORDER — CEFAZOLIN SODIUM-DEXTROSE 2-4 GM/100ML-% IV SOLN
2.0000 g | INTRAVENOUS | Status: AC
  Administered 2016-11-02: 2 g via INTRAVENOUS

## 2016-11-02 ENCOUNTER — Encounter: Payer: Self-pay | Admitting: *Deleted

## 2016-11-02 ENCOUNTER — Ambulatory Visit: Payer: BLUE CROSS/BLUE SHIELD | Admitting: Anesthesiology

## 2016-11-02 ENCOUNTER — Inpatient Hospital Stay
Admission: AD | Admit: 2016-11-02 | Discharge: 2016-11-04 | DRG: 742 | Disposition: A | Discharge status: Home / Self Care | Payer: BLUE CROSS/BLUE SHIELD | Source: Ambulatory Visit | Attending: Obstetrics and Gynecology | Admitting: Obstetrics and Gynecology

## 2016-11-02 ENCOUNTER — Encounter
Admission: AD | Disposition: A | Discharge status: Home / Self Care | Payer: Self-pay | Source: Ambulatory Visit | Attending: Obstetrics and Gynecology

## 2016-11-02 DIAGNOSIS — F17200 Nicotine dependence, unspecified, uncomplicated: Secondary | ICD-10-CM | POA: Diagnosis present

## 2016-11-02 DIAGNOSIS — Z9889 Other specified postprocedural states: Secondary | ICD-10-CM

## 2016-11-02 DIAGNOSIS — I10 Essential (primary) hypertension: Secondary | ICD-10-CM | POA: Diagnosis present

## 2016-11-02 DIAGNOSIS — D62 Acute posthemorrhagic anemia: Secondary | ICD-10-CM | POA: Diagnosis not present

## 2016-11-02 DIAGNOSIS — J45909 Unspecified asthma, uncomplicated: Secondary | ICD-10-CM | POA: Diagnosis present

## 2016-11-02 DIAGNOSIS — D259 Leiomyoma of uterus, unspecified: Principal | ICD-10-CM | POA: Diagnosis present

## 2016-11-02 DIAGNOSIS — N920 Excessive and frequent menstruation with regular cycle: Secondary | ICD-10-CM | POA: Diagnosis present

## 2016-11-02 DIAGNOSIS — N801 Endometriosis of ovary: Secondary | ICD-10-CM | POA: Diagnosis not present

## 2016-11-02 HISTORY — PX: OVARIAN CYST REMOVAL: SHX89

## 2016-11-02 HISTORY — PX: LAPAROTOMY: SHX154

## 2016-11-02 HISTORY — PX: MYOMECTOMY: SHX85

## 2016-11-02 LAB — ABO/RH: ABO/RH(D): O POS

## 2016-11-02 LAB — POCT PREGNANCY, URINE: PREG TEST UR: NEGATIVE

## 2016-11-02 SURGERY — MYOMECTOMY, ABDOMINAL APPROACH
Anesthesia: General | Laterality: Right | Wound class: Clean Contaminated

## 2016-11-02 MED ORDER — SUGAMMADEX SODIUM 200 MG/2ML IV SOLN
INTRAVENOUS | Status: DC | PRN
  Administered 2016-11-02: 160 mg via INTRAVENOUS

## 2016-11-02 MED ORDER — OXYCODONE-ACETAMINOPHEN 5-325 MG PO TABS
1.0000 | ORAL_TABLET | ORAL | Status: DC | PRN
  Administered 2016-11-02: 2 via ORAL
  Administered 2016-11-03 (×2): 1 via ORAL
  Administered 2016-11-03 (×4): 2 via ORAL
  Administered 2016-11-04: 1 via ORAL
  Administered 2016-11-04: 2 via ORAL
  Administered 2016-11-04: 1 via ORAL
  Filled 2016-11-02 (×4): qty 2
  Filled 2016-11-02 (×2): qty 1
  Filled 2016-11-02: qty 2
  Filled 2016-11-02 (×2): qty 1
  Filled 2016-11-02 (×2): qty 2

## 2016-11-02 MED ORDER — SODIUM CHLORIDE 0.9 % IJ SOLN
INTRAMUSCULAR | Status: AC
  Filled 2016-11-02: qty 100

## 2016-11-02 MED ORDER — ROCURONIUM BROMIDE 100 MG/10ML IV SOLN
INTRAVENOUS | Status: DC | PRN
  Administered 2016-11-02: 40 mg via INTRAVENOUS
  Administered 2016-11-02 (×2): 10 mg via INTRAVENOUS

## 2016-11-02 MED ORDER — PROPOFOL 10 MG/ML IV BOLUS
INTRAVENOUS | Status: DC | PRN
  Administered 2016-11-02: 120 mg via INTRAVENOUS

## 2016-11-02 MED ORDER — EPHEDRINE SULFATE 50 MG/ML IJ SOLN
INTRAMUSCULAR | Status: DC | PRN
  Administered 2016-11-02: 10 mg via INTRAVENOUS

## 2016-11-02 MED ORDER — ROCURONIUM BROMIDE 50 MG/5ML IV SOLN
INTRAVENOUS | Status: AC
  Filled 2016-11-02: qty 1

## 2016-11-02 MED ORDER — MORPHINE SULFATE (PF) 2 MG/ML IV SOLN
1.0000 mg | INTRAVENOUS | Status: DC | PRN
  Administered 2016-11-02: 2 mg via INTRAVENOUS
  Filled 2016-11-02: qty 1

## 2016-11-02 MED ORDER — FENTANYL CITRATE (PF) 100 MCG/2ML IJ SOLN
INTRAMUSCULAR | Status: AC
  Administered 2016-11-02: 25 ug via INTRAVENOUS
  Filled 2016-11-02: qty 2

## 2016-11-02 MED ORDER — FAMOTIDINE 20 MG PO TABS
ORAL_TABLET | ORAL | Status: AC
  Filled 2016-11-02: qty 1

## 2016-11-02 MED ORDER — ONDANSETRON HCL 4 MG/2ML IJ SOLN
INTRAMUSCULAR | Status: DC | PRN
  Administered 2016-11-02: 4 mg via INTRAVENOUS

## 2016-11-02 MED ORDER — SUGAMMADEX SODIUM 200 MG/2ML IV SOLN
INTRAVENOUS | Status: AC
  Filled 2016-11-02: qty 2

## 2016-11-02 MED ORDER — BUPIVACAINE 0.25 % ON-Q PUMP DUAL CATH 400 ML
400.0000 mL | INJECTION | Status: DC
  Filled 2016-11-02: qty 400

## 2016-11-02 MED ORDER — ONDANSETRON HCL 4 MG/2ML IJ SOLN: 4.0000 mg | Freq: Once | INTRAMUSCULAR | Status: DC | PRN

## 2016-11-02 MED ORDER — DEXTROSE-NACL 5-0.45 % IV SOLN
INTRAVENOUS | Status: DC
  Administered 2016-11-02: 18:00:00 via INTRAVENOUS

## 2016-11-02 MED ORDER — MENTHOL 3 MG MT LOZG
1.0000 | LOZENGE | OROMUCOSAL | Status: DC | PRN
  Administered 2016-11-03: 3 mg via ORAL
  Filled 2016-11-02 (×2): qty 9

## 2016-11-02 MED ORDER — KETOROLAC TROMETHAMINE 30 MG/ML IJ SOLN: 30.0000 mg | Freq: Four times a day (QID) | INTRAMUSCULAR | Status: DC

## 2016-11-02 MED ORDER — MIDAZOLAM HCL 2 MG/2ML IJ SOLN
INTRAMUSCULAR | Status: AC
  Filled 2016-11-02: qty 2

## 2016-11-02 MED ORDER — VASOPRESSIN 20 UNIT/ML IV SOLN
INTRAVENOUS | Status: DC | PRN
  Administered 2016-11-02: .2 [IU]

## 2016-11-02 MED ORDER — FAMOTIDINE 20 MG PO TABS
20.0000 mg | ORAL_TABLET | Freq: Once | ORAL | Status: AC
  Administered 2016-11-02: 20 mg via ORAL

## 2016-11-02 MED ORDER — FENTANYL CITRATE (PF) 100 MCG/2ML IJ SOLN
INTRAMUSCULAR | Status: DC | PRN
  Administered 2016-11-02: 25 ug via INTRAVENOUS
  Administered 2016-11-02: 100 ug via INTRAVENOUS
  Administered 2016-11-02: 25 ug via INTRAVENOUS

## 2016-11-02 MED ORDER — MIDAZOLAM HCL 2 MG/2ML IJ SOLN
INTRAMUSCULAR | Status: DC | PRN
  Administered 2016-11-02: 2 mg via INTRAVENOUS

## 2016-11-02 MED ORDER — BUPIVACAINE HCL (PF) 0.5 % IJ SOLN
INTRAMUSCULAR | Status: DC | PRN
  Administered 2016-11-02: 10 mL

## 2016-11-02 MED ORDER — BUPIVACAINE HCL (PF) 0.5 % IJ SOLN
INTRAMUSCULAR | Status: AC
  Filled 2016-11-02: qty 30

## 2016-11-02 MED ORDER — NICOTINE 21 MG/24HR TD PT24
21.0000 mg | MEDICATED_PATCH | Freq: Every day | TRANSDERMAL | Status: DC
  Administered 2016-11-02 – 2016-11-04 (×3): 21 mg via TRANSDERMAL
  Filled 2016-11-02 (×4): qty 1

## 2016-11-02 MED ORDER — CEFAZOLIN SODIUM-DEXTROSE 2-4 GM/100ML-% IV SOLN
INTRAVENOUS | Status: AC
  Filled 2016-11-02: qty 100

## 2016-11-02 MED ORDER — ONDANSETRON HCL 4 MG/2ML IJ SOLN: 4.0000 mg | Freq: Four times a day (QID) | INTRAMUSCULAR | Status: DC | PRN

## 2016-11-02 MED ORDER — ONDANSETRON HCL 4 MG/2ML IJ SOLN
INTRAMUSCULAR | Status: AC
  Filled 2016-11-02: qty 2

## 2016-11-02 MED ORDER — ALBUTEROL SULFATE (2.5 MG/3ML) 0.083% IN NEBU: 3.0000 mL | INHALATION_SOLUTION | Freq: Four times a day (QID) | RESPIRATORY_TRACT | Status: DC | PRN

## 2016-11-02 MED ORDER — FENTANYL CITRATE (PF) 100 MCG/2ML IJ SOLN
INTRAMUSCULAR | Status: AC
  Filled 2016-11-02: qty 2

## 2016-11-02 MED ORDER — FENTANYL CITRATE (PF) 100 MCG/2ML IJ SOLN
25.0000 ug | INTRAMUSCULAR | Status: DC | PRN
  Administered 2016-11-02 (×3): 25 ug via INTRAVENOUS

## 2016-11-02 MED ORDER — LIDOCAINE HCL (CARDIAC) 20 MG/ML IV SOLN
INTRAVENOUS | Status: DC | PRN
  Administered 2016-11-02: 40 mg via INTRAVENOUS

## 2016-11-02 MED ORDER — VASOPRESSIN 20 UNIT/ML IV SOLN
INTRAVENOUS | Status: AC
  Filled 2016-11-02: qty 1

## 2016-11-02 MED ORDER — PROPOFOL 10 MG/ML IV BOLUS
INTRAVENOUS | Status: AC
  Filled 2016-11-02: qty 20

## 2016-11-02 MED ORDER — LACTATED RINGERS IV SOLN
INTRAVENOUS | Status: DC
  Administered 2016-11-02 (×3): via INTRAVENOUS

## 2016-11-02 MED ORDER — PHENYLEPHRINE HCL 10 MG/ML IJ SOLN
INTRAMUSCULAR | Status: DC | PRN
  Administered 2016-11-02: 100 ug via INTRAVENOUS

## 2016-11-02 MED ORDER — KETOROLAC TROMETHAMINE 30 MG/ML IJ SOLN
30.0000 mg | Freq: Four times a day (QID) | INTRAMUSCULAR | Status: DC
  Administered 2016-11-02 – 2016-11-03 (×3): 30 mg via INTRAVENOUS
  Filled 2016-11-02 (×3): qty 1

## 2016-11-02 MED ORDER — DEXAMETHASONE SODIUM PHOSPHATE 10 MG/ML IJ SOLN
INTRAMUSCULAR | Status: DC | PRN
  Administered 2016-11-02: 10 mg via INTRAVENOUS

## 2016-11-02 SURGICAL SUPPLY — 54 items
BARRIER ADH SEPRAFILM 3INX5IN (MISCELLANEOUS) ×3 IMPLANT
CANISTER SUCT 1200ML W/VALVE (MISCELLANEOUS) ×3 IMPLANT
CATH KIT ON-Q SILVERSOAK 5IN (CATHETERS) ×9 IMPLANT
CHLORAPREP W/TINT 26ML (MISCELLANEOUS) ×6 IMPLANT
CNTNR SPEC 2.5X3XGRAD LEK (MISCELLANEOUS) ×2
CONT SPEC 4OZ STER OR WHT (MISCELLANEOUS) ×1
CONTAINER SPEC 2.5X3XGRAD LEK (MISCELLANEOUS) ×2 IMPLANT
DRAPE LAPAROTOMY 100X77 ABD (DRAPES) IMPLANT
DRAPE LAPAROTOMY TRNSV 106X77 (MISCELLANEOUS) IMPLANT
DRESSING SURGICEL FIBRLLR 1X2 (HEMOSTASIS) ×2 IMPLANT
DRSG OPSITE POSTOP 4X8 (GAUZE/BANDAGES/DRESSINGS) ×3 IMPLANT
DRSG SURGICEL FIBRILLAR 1X2 (HEMOSTASIS) ×3
DRSG TELFA 3X8 NADH (GAUZE/BANDAGES/DRESSINGS) ×3 IMPLANT
ELECT BLADE 6 FLAT ULTRCLN (ELECTRODE) ×3 IMPLANT
ELECT CAUTERY BLADE 6.4 (BLADE) ×3 IMPLANT
ELECT REM PT RETURN 9FT ADLT (ELECTROSURGICAL) ×3
ELECTRODE REM PT RTRN 9FT ADLT (ELECTROSURGICAL) ×2 IMPLANT
GAUZE SPONGE 4X4 12PLY STRL (GAUZE/BANDAGES/DRESSINGS) ×3 IMPLANT
GLOVE BIO SURGEON STRL SZ7 (GLOVE) ×3 IMPLANT
GLOVE BIOGEL PI IND STRL 7.5 (GLOVE) ×10 IMPLANT
GLOVE BIOGEL PI INDICATOR 7.5 (GLOVE) ×5
GLOVE INDICATOR 7.5 STRL GRN (GLOVE) ×3 IMPLANT
GLOVE PROTEXIS LATEX SZ 7.5 (GLOVE) ×15 IMPLANT
GLOVE SURG SYN 7.0 (GLOVE) ×6 IMPLANT
GOWN STRL REUS W/ TWL LRG LVL3 (GOWN DISPOSABLE) ×8 IMPLANT
GOWN STRL REUS W/ TWL XL LVL3 (GOWN DISPOSABLE) ×2 IMPLANT
GOWN STRL REUS W/TWL LRG LVL3 (GOWN DISPOSABLE) ×4
GOWN STRL REUS W/TWL XL LVL3 (GOWN DISPOSABLE) ×1
HOLDER FOLEY CATH W/STRAP (MISCELLANEOUS) ×3 IMPLANT
NDL HPO THNWL 1X22GA REG BVL (NEEDLE) ×2 IMPLANT
NEEDLE SAFETY 22GX1 (NEEDLE) ×1
NS IRRIG 1000ML POUR BTL (IV SOLUTION) ×3 IMPLANT
PACK BASIN MAJOR ARMC (MISCELLANEOUS) ×3 IMPLANT
PAD OB MATERNITY 4.3X12.25 (PERSONAL CARE ITEMS) ×3 IMPLANT
RETRACTOR WOUND ALXS 18CM MED (MISCELLANEOUS) ×2 IMPLANT
RTRCTR WOUND ALEXIS O 18CM MED (MISCELLANEOUS) ×3
SPONGE LAP 18X18 5 PK (GAUZE/BANDAGES/DRESSINGS) ×6 IMPLANT
STAPLER INSORB 30 2030 C-SECTI (MISCELLANEOUS) ×3 IMPLANT
STAPLER SKIN PROX 35W (STAPLE) ×3 IMPLANT
STRAP SAFETY BODY (MISCELLANEOUS) ×3 IMPLANT
STRIP CLOSURE SKIN 1/2X4 (GAUZE/BANDAGES/DRESSINGS) ×3 IMPLANT
SUT MNCRL 4-0 (SUTURE) ×4
SUT MNCRL 4-0 27XMFL (SUTURE) ×8
SUT PDS AB 1 TP1 96 (SUTURE) ×3 IMPLANT
SUT VIC AB 0 CT1 27 (SUTURE) ×1
SUT VIC AB 0 CT1 27XCR 8 STRN (SUTURE) ×2 IMPLANT
SUT VIC AB 0 CT1 36 (SUTURE) ×6 IMPLANT
SUT VIC AB 4-0 PS2 18 (SUTURE) IMPLANT
SUT VICRYL PLUS ABS 0 54 (SUTURE) ×6 IMPLANT
SUTURE MNCRL 4-0 27XMF (SUTURE) ×8 IMPLANT
SYR 5ML 18GX1 1/2 (NEEDLE) ×3 IMPLANT
SYR CONTROL 10ML (SYRINGE) ×6 IMPLANT
TRAY FOLEY W/METER SILVER 16FR (SET/KITS/TRAYS/PACK) ×3 IMPLANT
TRAY PREP VAG/GEN (MISCELLANEOUS) IMPLANT

## 2016-11-02 NOTE — Anesthesia Procedure Notes (Signed)
Procedure Name: Intubation Date/Time: 11/02/2016 1:24 PM Performed by: Allean Found Pre-anesthesia Checklist: Patient identified, Emergency Drugs available, Suction available, Patient being monitored and Timeout performed Patient Re-evaluated:Patient Re-evaluated prior to induction Oxygen Delivery Method: Circle system utilized Preoxygenation: Pre-oxygenation with 100% oxygen Induction Type: IV induction Ventilation: Mask ventilation without difficulty Laryngoscope Size: Mac and 3 Grade View: Grade II Tube type: Oral Tube size: 7.0 mm Number of attempts: 1 Airway Equipment and Method: Stylet Placement Confirmation: ETT inserted through vocal cords under direct vision,  positive ETCO2 and breath sounds checked- equal and bilateral Secured at: 21 cm Tube secured with: Tape Dental Injury: Teeth and Oropharynx as per pre-operative assessment

## 2016-11-02 NOTE — H&P (Signed)
Date of Initial H&P: 10/23/2016  History reviewed, patient examined, no change in status, stable for surgery.

## 2016-11-02 NOTE — Anesthesia Preprocedure Evaluation (Signed)
Anesthesia Evaluation  Patient identified by MRN, date of birth, ID band Patient awake    Reviewed: Allergy & Precautions, NPO status , Patient's Chart, lab work & pertinent test results  History of Anesthesia Complications Negative for: history of anesthetic complications  Airway Mallampati: II       Dental   Pulmonary asthma , Current Smoker,           Cardiovascular hypertension, Pt. on medications (-) Past MI and (-) CHF (-) dysrhythmias (-) Valvular Problems/Murmurs     Neuro/Psych neg Seizures Anxiety Depression    GI/Hepatic Neg liver ROS, neg GERD  ,  Endo/Other  neg diabetes  Renal/GU negative Renal ROS     Musculoskeletal   Abdominal   Peds  Hematology   Anesthesia Other Findings   Reproductive/Obstetrics                            Anesthesia Physical Anesthesia Plan  ASA: II  Anesthesia Plan: General   Post-op Pain Management:    Induction: Intravenous  PONV Risk Score and Plan:   Airway Management Planned: Oral ETT  Additional Equipment:   Intra-op Plan:   Post-operative Plan:   Informed Consent: I have reviewed the patients History and Physical, chart, labs and discussed the procedure including the risks, benefits and alternatives for the proposed anesthesia with the patient or authorized representative who has indicated his/her understanding and acceptance.     Plan Discussed with:   Anesthesia Plan Comments:         Anesthesia Quick Evaluation

## 2016-11-02 NOTE — Op Note (Signed)
Preoperative Diagnosis: 1) 31 y.o. with uterine fibroids 2) Menorrhagia 3) Complex right ovarian cyst  Postoperative Diagnosis: 1) 31 y.o. with uterine fibroids 2) Menorrhagia 3) Right ovarian endometrioma  Operation Performed: Abdominal myomectomy, right ovarian cystectomy  Indication: 31 yo with menorrhagia, right complex ovarian cyst stable on follow up imaging, with desired future fertility  Anesthesia: General  Primary Surgeon: Malachy Mood, MD  Assistant: Barnett Applebaum, MD  Preoperative Antibiotics: 2g Ancef  Estimated Blood Loss: 255mL  IV Fluids: 1340mL  Urine Output:: 164mL  Drains or Tubes: Foley to gravity drainage, ON-Q catheter system  Implants: none  Specimens Removed: Right ovarian cyst wall, 4 fibroids  Complications: none  Intraoperative Findings:  Enlarged fibroid uterus to dominant fibroids posterior fundal, and right anterior fundal, two smaller fibroids left lateral cornua.  Normal tubes, normal left ovary.  Right ovary with adhesive disease to the posterior uterus containing an approximately 4cm myoma  Patient Condition: stable  Procedure in Detail:  Patient was taken to the operating room were she was administered regional anesthesia.  She was positioned in the supine position, prepped and draped in the  Usual sterile fashion.  Prior to proceeding with the case a time out was performed and the level of anesthetic was checked and noted to be adequate.  Utilizing the scalpel a pfannenstiel skin incision was made 2cm above the pubic symphysis and carried down sharply to the the level of the rectus fascia.  The fascia was incised in the midline using the scalpel and then extended using mayo scissors.  The superior border of the rectus fascia was grasped with two Kocher clamps and the underlying rectus muscles were dissected of the fascia using blunt dissection.  The median raphae was incised using Mayo scissors.   The inferior border of the rectus fascia  was dissected of the rectus muscles in a similar fashion.  The midline was identified, the peritoneum was entered bluntly and expanded using manual tractions.  A medium Alexa self-retaining retractor was placed.  The posterior fundal fibroid was injected with 0.2U/mL Vasopressin in normal saline.  The capsule was incised and the fibroid was shelled out.  This procedure was repeated for the right cornual fibroid.  Next the dominant right anterior fundal fibroid was injected with Vasopressin.  The capsule was incised and the fibroid was shelled out using Metzenbaum scissors.  The this fibroid did extend partially into the uterine cavity which was entered during removal of the fibroid.  Once the fibroid was removed the other left cornual fibroid was able to be removed through the same incision.  A uterine sound was used to mark the endometrial cavity.  The myometrium was closed in later using running stiches of 0 Vicryl.  The serosa was then closed using 4-0 Monocryl in a subcuticular fashion.  The remaining uterine defects were closed in a similar fashion.  The uterus was inspected with good approximation of the serosal surfaces and hemostasis noted.    Attention was turned to the right ovary.  This was able to be elevated out of the pelvis using the operators finger to find a plane of dissection between the ovary and the posterior uterus.  The ovary was incised with a Bovie noting thick brown fluid consistent with and endometrioma.  The cyst wall was teased out using Alice clamps.  The cyst cavity was then fulgurated using the Bovie.  Fibrillar was applied to the ovary and the posterior cul de sac.  Sepra film was then applied across  the uterine serosal incisions.    The rectus muscles were inspected noted to be hemostatic.  The superior border of the rectus fascia was grasped with a Kocher clamp.  The ON-Q trocars were then placed 4cm above the superior border of the incision and tunneled subfascially.  The  introducers were removed and the catheters were threaded through the sleeves after which the sleeves were removed.  The fascia was closed using a looped #1 PDS in a running fashion taking 1cm by 1cm bites.  The subcutaneous tissue was irrigated using warm saline, hemostasis achieved using the bovie.  The skin was closed using Insorb staples.  Sponge needle and instrument counts were corrects times two.  The patient tolerated the procedure well and was taken to the recovery room in stable condition.

## 2016-11-02 NOTE — Anesthesia Postprocedure Evaluation (Signed)
Anesthesia Post Note  Patient: Rachel Garrett  Procedure(s) Performed: Procedure(s) (LRB): MYOMECTOMY (N/A) OVARIAN CYSTECTOMY (Right) LAPAROTOMY (N/A)  Patient location during evaluation: PACU Anesthesia Type: General Level of consciousness: awake and alert Pain management: pain level controlled Vital Signs Assessment: post-procedure vital signs reviewed and stable Respiratory status: spontaneous breathing, nonlabored ventilation, respiratory function stable and patient connected to nasal cannula oxygen Cardiovascular status: blood pressure returned to baseline and stable Postop Assessment: no apparent nausea or vomiting Anesthetic complications: no     Last Vitals:  Vitals:   11/02/16 1605 11/02/16 1610  BP:  113/71  Pulse: 96 91  Resp: 12 13  Temp:    SpO2: 98% 96%    Last Pain:  Vitals:   11/02/16 1605  TempSrc:   PainSc: [place]

## 2016-11-02 NOTE — Anesthesia Post-op Follow-up Note (Signed)
Anesthesia QCDR form completed.        

## 2016-11-02 NOTE — Transfer of Care (Signed)
Immediate Anesthesia Transfer of Care Note  Patient: Rachel Garrett  Procedure(s) Performed: Procedure(s): MYOMECTOMY (N/A) OVARIAN CYSTECTOMY (Right) LAPAROTOMY (N/A)  Patient Location: PACU  Anesthesia Type:General  Level of Consciousness: drowsy and patient cooperative  Airway & Oxygen Therapy: Patient Spontanous Breathing and Patient connected to face mask oxygen  Post-op Assessment: Report given to RN, Post -op Vital signs reviewed and stable and Patient moving all extremities X 4  Post vital signs: Reviewed and stable  Last Vitals:  Vitals:   11/02/16 1210  BP: 114/77  Pulse: 76  Resp: 12  Temp: 36.8 C  SpO2: 100%    Last Pain:  Vitals:   11/02/16 1210  TempSrc: Oral         Complications: No apparent anesthesia complications

## 2016-11-03 ENCOUNTER — Telehealth: Payer: Self-pay

## 2016-11-03 ENCOUNTER — Encounter: Payer: Self-pay | Admitting: Obstetrics and Gynecology

## 2016-11-03 LAB — CBC
HEMATOCRIT: 28.1 % — AB (ref 35.0–47.0)
Hemoglobin: 8.9 g/dL — ABNORMAL LOW (ref 12.0–16.0)
MCH: 23.8 pg — ABNORMAL LOW (ref 26.0–34.0)
MCHC: 31.6 g/dL — ABNORMAL LOW (ref 32.0–36.0)
MCV: 75.3 fL — AB (ref 80.0–100.0)
Platelets: 183 10*3/uL (ref 150–440)
RBC: 3.73 MIL/uL — ABNORMAL LOW (ref 3.80–5.20)
RDW: 20.6 % — AB (ref 11.5–14.5)
WBC: 15.3 10*3/uL — ABNORMAL HIGH (ref 3.6–11.0)

## 2016-11-03 LAB — BASIC METABOLIC PANEL
ANION GAP: 6 (ref 5–15)
BUN: 11 mg/dL (ref 6–20)
CHLORIDE: 109 mmol/L (ref 101–111)
CO2: 23 mmol/L (ref 22–32)
Calcium: 8.3 mg/dL — ABNORMAL LOW (ref 8.9–10.3)
Creatinine, Ser: 0.77 mg/dL (ref 0.44–1.00)
GFR calc non Af Amer: >60 mL/min (ref >60)
GLUCOSE: 131 mg/dL — AB (ref 65–99)
POTASSIUM: 3.9 mmol/L (ref 3.5–5.1)
Sodium: 138 mmol/L (ref 135–145)

## 2016-11-03 MED ORDER — IBUPROFEN 600 MG PO TABS
600.0000 mg | ORAL_TABLET | Freq: Four times a day (QID) | ORAL | Status: DC | PRN
  Administered 2016-11-03 – 2016-11-04 (×4): 600 mg via ORAL
  Filled 2016-11-03 (×4): qty 1

## 2016-11-03 MED ORDER — HYDROXYZINE HCL 25 MG PO TABS
25.0000 mg | ORAL_TABLET | Freq: Three times a day (TID) | ORAL | Status: DC | PRN
  Administered 2016-11-03: 25 mg via ORAL
  Filled 2016-11-03: qty 1

## 2016-11-03 NOTE — Telephone Encounter (Signed)
[employer] called to confirm pt had surgery and how long pt would be on disability - adv 6 wks.

## 2016-11-03 NOTE — Telephone Encounter (Signed)
[facility] called to confirm surgery date and type of surgery done.

## 2016-11-03 NOTE — Progress Notes (Signed)
Obstetric and Gynecology  Subjective  Pain adequately controlled on po analgesics.  No nausea, vomiting, fevers, or chills.  Objective   Vitals:   11/03/16 0316 11/03/16 0747  BP: 104/63 115/69  Pulse: 87 82  Resp: 18 17  Temp: 98 F (36.7 C) 97.8 F (36.6 C)  SpO2: 98% 99%     Intake/Output Summary (Last 24 hours) at 11/03/16 3614 Last data filed at 11/02/16 2330  Gross per 24 hour  Intake          1929.17 ml  Output             1165 ml  Net           764.17 ml    General: NAD Abdomen: Soft,appropriately tender around incision, non-distended, incision D/C/I, ON-Q site D/C/I Extremities: no edema  Labs: Results for orders placed or performed during the hospital encounter of 11/02/16 (from the past 24 hour(s))  Pregnancy, urine POC     Status: None   Collection Time: 11/02/16 12:18 PM  Result Value Ref Range   Preg Test, Ur NEGATIVE NEGATIVE  ABO/Rh     Status: None   Collection Time: 11/02/16 12:31 PM  Result Value Ref Range   ABO/RH(D) O POS   CBC     Status: Abnormal   Collection Time: 11/03/16  4:39 AM  Result Value Ref Range   WBC 15.3 (H) 3.6 - 11.0 K/uL   RBC 3.73 (L) 3.80 - 5.20 MIL/uL   Hemoglobin 8.9 (L) 12.0 - 16.0 g/dL   HCT 28.1 (L) 35.0 - 47.0 %   MCV 75.3 (L) 80.0 - 100.0 fL   MCH 23.8 (L) 26.0 - 34.0 pg   MCHC 31.6 (L) 32.0 - 36.0 g/dL   RDW 20.6 (H) 11.5 - 14.5 %   Platelets 183 150 - 440 K/uL  Basic metabolic panel     Status: Abnormal   Collection Time: 11/03/16  4:39 AM  Result Value Ref Range   Sodium 138 135 - 145 mmol/L   Potassium 3.9 3.5 - 5.1 mmol/L   Chloride 109 101 - 111 mmol/L   CO2 23 22 - 32 mmol/L   Glucose, Bld 131 (H) 65 - 99 mg/dL   BUN 11 6 - 20 mg/dL   Creatinine, Ser 0.77 0.44 - 1.00 mg/dL   Calcium 8.3 (L) 8.9 - 10.3 mg/dL   GFR calc non Af Amer >60 >60 mL/min   GFR calc Af Amer >60 >60 mL/min   Anion gap 6 5 - 15    Cultures: Results for orders placed or performed in visit on 08/04/12  Wet prep, genital      Status: None   Collection Time: 08/04/12 11:32 PM  Result Value Ref Range Status   Micro Text Report   Final       COMMENT                   MANY WHITE BLOOD CELLS SEEN   COMMENT                   CLUE CELLS SEEN   COMMENT                   NO TRICHOMONAS SEEN   COMMENT                   NO YEAST SEEN   COMMENT  NO SPERMATOZOA SEEN   ANTIBIOTIC                                                      GC/Chlamydia Probe Amp     Status: None   Collection Time: 08/04/12 11:32 PM  Result Value Ref Range Status   Micro Text Report   Final       CHLAMYDIA                 CHLAMYDIA TRACHOMATIS NEGATIVE   N.GONORRHOEAE             N.GONORRHOEAE NEGATIVE   ANTIBIOTIC                                                        Imaging:  Assessment   31 y.o. G2P0020 POD1 abdominal myomectomy and right ovarian cystectomy  Plan    - encourage ambulation - abdominal binder if available - discontinue foley and IV - d/c toradol and transition to po ibuprofen - Acute blood loss anemia - appropriate drop and asymptomatic, po ferrous sulfate outpatient - anticipate discharge POD2-3

## 2016-11-04 MED ORDER — MEDROXYPROGESTERONE ACETATE 150 MG/ML IM SUSP
150.0000 mg | Freq: Once | INTRAMUSCULAR | Status: AC
  Administered 2016-11-04: 150 mg via INTRAMUSCULAR
  Filled 2016-11-04: qty 1

## 2016-11-04 MED ORDER — OXYCODONE-ACETAMINOPHEN 5-325 MG PO TABS: 1.0000 | ORAL_TABLET | ORAL | 0 refills | Status: DC | PRN

## 2016-11-04 MED ORDER — HYDROXYZINE HCL 25 MG PO TABS: 25.0000 mg | ORAL_TABLET | Freq: Four times a day (QID) | ORAL | 2 refills | Status: DC | PRN

## 2016-11-04 MED ORDER — IBUPROFEN 600 MG PO TABS: 600.0000 mg | ORAL_TABLET | Freq: Four times a day (QID) | ORAL | 0 refills | Status: DC | PRN

## 2016-11-04 NOTE — H&P (Signed)
Obstetric and Gynecology  Subjective  Pain well controlled, no N/V.  Did feel subjectively febrile yesterday but no recorded temps.  Getting a little winded with walking but no shortness of breath at rest or chest pain/cough.   Objective   T 98.44F (97.8-98.45F) BP 115/68 (102-115 / 60-69) HR 111 (82-111) RR 17 (17-18) O2 sat 100% RA (99-100)  General: NAD Pulmonary: No increased work of breathing Abdomen: soft, appropriately tender, non-distended Extremities: no edema  Labs: No results found for this or any previous visit (from the past 24 hour(s)).  Cultures: Results for orders placed or performed in visit on 08/04/12  Wet prep, genital     Status: None   Collection Time: 08/04/12 11:32 PM  Result Value Ref Range Status   Micro Text Report   Final       COMMENT                   MANY WHITE BLOOD CELLS SEEN   COMMENT                   CLUE CELLS SEEN   COMMENT                   NO TRICHOMONAS SEEN   COMMENT                   NO YEAST SEEN   COMMENT                   NO SPERMATOZOA SEEN   ANTIBIOTIC                                                      GC/Chlamydia Probe Amp     Status: None   Collection Time: 08/04/12 11:32 PM  Result Value Ref Range Status   Micro Text Report   Final       CHLAMYDIA                 CHLAMYDIA TRACHOMATIS NEGATIVE   N.GONORRHOEAE             N.GONORRHOEAE NEGATIVE   ANTIBIOTIC                                                        Imaging:  Assessment   31 y.o. G2P0020 POD2 abdominal myomectomy, right ovarian cystectom  Plan   - Discharge home - Can remove ON-Q in clinic if patient not comfortable removing at home on Monday - Depo provera administration prior to discharge for endometriosis - Follow up 1 week

## 2016-11-04 NOTE — Discharge Summary (Addendum)
Physician Discharge Summary  Patient ID: Rachel Garrett MRN: [id number] DOB/AGE: 08/13/1985 31 y.o.  Admit date: 11/02/2016 Discharge date: 11/04/2016  Admission Diagnoses: Uterine fibroid and complex right ovarian cyst  Discharge Diagnoses:  Active Problems:   S/P myomectomy 1) Uterine fibroids 2) Right ovarian endometrioma  Discharged Condition: good  Hospital Course: Patient underwent uncomplicated abdominal myomectomy, right ovarian cystectomy for what appeared to be a endometrioma.  Patient opts for depo provera administration for management of endometriosis and contraception while waiting for myomectomy to heal prior to attempting pregnancy.  Her postoperative course was benign.  She remained afebrile and hemodynamically stable throughout admission.  Postoperative day 1 labs revealed acute blood loss anemia but appropriate drop for the surgical blood loss she encountered.  Will start on ferrous sulfate after first week once bowl movements have regulated.  Consults: None  Significant Diagnostic Studies:  Results for orders placed or performed during the hospital encounter of 11/02/16 (from the past 72 hour(s))  Pregnancy, urine POC     Status: None   Collection Time: 11/02/16 12:18 PM  Result Value Ref Range   Preg Test, Ur NEGATIVE NEGATIVE    Comment:        THE SENSITIVITY OF THIS METHODOLOGY IS >24 mIU/mL   ABO/Rh     Status: None   Collection Time: 11/02/16 12:31 PM  Result Value Ref Range   ABO/RH(D) O POS   CBC     Status: Abnormal   Collection Time: 11/03/16  4:39 AM  Result Value Ref Range   WBC 15.3 (H) 3.6 - 11.0 K/uL   RBC 3.73 (L) 3.80 - 5.20 MIL/uL   Hemoglobin 8.9 (L) 12.0 - 16.0 g/dL   HCT 28.1 (L) 35.0 - 47.0 %   MCV 75.3 (L) 80.0 - 100.0 fL   MCH 23.8 (L) 26.0 - 34.0 pg   MCHC 31.6 (L) 32.0 - 36.0 g/dL   RDW 20.6 (H) 11.5 - 14.5 %   Platelets 183 150 - 440 K/uL  Basic metabolic panel     Status: Abnormal   Collection Time: 11/03/16  4:39 AM   Result Value Ref Range   Sodium 138 135 - 145 mmol/L   Potassium 3.9 3.5 - 5.1 mmol/L   Chloride 109 101 - 111 mmol/L   CO2 23 22 - 32 mmol/L   Glucose, Bld 131 (H) 65 - 99 mg/dL   BUN 11 6 - 20 mg/dL   Creatinine, Ser 0.77 0.44 - 1.00 mg/dL   Calcium 8.3 (L) 8.9 - 10.3 mg/dL   GFR calc non Af Amer >60 >60 mL/min   GFR calc Af Amer >60 >60 mL/min    Comment: (NOTE) The eGFR has been calculated using the CKD EPI equation. This calculation has not been validated in all clinical situations. eGFR's persistently <60 mL/min signify possible Chronic Kidney Disease.    Anion gap 6 5 - 15     Treatments: Abdominal myomectomy and right ovarian cystectomy  Discharge Exam: Blood pressure 115/68, pulse (!) 111, temperature 98.2 F (36.8 C), temperature source Oral, resp. rate 17, height 5' 7"  (1.702 m), weight 173 lb (78.5 kg), last menstrual period 10/30/2016, SpO2 100 %. General appearance: alert, appears stated age and no distress GI: soft, non-distended, appropriately tender around incision, incision D/C/I Extremities: extremities normal, atraumatic, no cyanosis or edema  Disposition: 01-Home or Self Care  Discharge Instructions    Call MD for:    Complete by:  As directed    Heavy vaginal  bleeding greater than 1 pad an hour   Call MD for:  difficulty breathing, headache or visual disturbances    Complete by:  As directed    Call MD for:  extreme fatigue    Complete by:  As directed    Call MD for:  hives    Complete by:  As directed    Call MD for:  persistant dizziness or light-headedness    Complete by:  As directed    Call MD for:  persistant nausea and vomiting    Complete by:  As directed    Call MD for:  redness, tenderness, or signs of infection (pain, swelling, redness, odor or green/yellow discharge around incision site)    Complete by:  As directed    Call MD for:  severe uncontrolled pain    Complete by:  As directed    Call MD for:  temperature >100.4     Complete by:  As directed    Diet general    Complete by:  As directed    Discharge wound care:    Complete by:  As directed    You may apply a light dressing for minor discharge from the incision or to keep waistbands of clothing from rubbing.  You may also have been discharge with a clear dressing in which case this will be removed at your postoperative clinic visit.  You may shower, use soap on your incision.  Avoiding baths or soaking the incision in the first 6 weeks following your surgery..   Driving restriction    Complete by:  As directed    Avoid driving for at least 2 weeks or while taking prescription narcotics.   Lifting restrictions    Complete by:  As directed    Weight restriction of 10lbs for 6 weeks.   Sexual acrtivity    Complete by:  As directed    No intercourse, tampons, or anything vaginally for 6 weeks     Allergies as of 11/04/2016      Reactions   Benadryl [diphenhydramine Hcl (sleep)] Swelling   Latex Itching, Swelling   Ultram [tramadol Hcl] Itching      Medication List    STOP taking these medications   aspirin-acetaminophen-caffeine 250-250-65 MG tablet Commonly known as:  EXCEDRIN MIGRAINE     TAKE these medications   albuterol 108 (90 Base) MCG/ACT inhaler Commonly known as:  PROVENTIL HFA;VENTOLIN HFA Inhale 2 puffs into the lungs every 6 (six) hours as needed (for wheezing/shortness of breath).   citalopram 20 MG tablet Commonly known as:  CELEXA Take 20 mg by mouth daily with breakfast.   cyclobenzaprine 10 MG tablet Commonly known as:  FLEXERIL Take 10 mg by mouth 3 (three) times daily as needed (for muscle spasms).   hydrochlorothiazide 12.5 MG capsule Commonly known as:  MICROZIDE Take 12.5 mg by mouth daily with breakfast.   hydrOXYzine 25 MG tablet Commonly known as:  ATARAX/VISTARIL Take 1 tablet (25 mg total) by mouth every 6 (six) hours as needed for itching.   ibuprofen 600 MG tablet Commonly known as:  ADVIL,MOTRIN Take  1 tablet (600 mg total) by mouth every 6 (six) hours as needed for mild pain, moderate pain or cramping.   OLIVE LEAF PO Take 750 mg by mouth 4 (four) times daily.   oxyCODONE-acetaminophen 5-325 MG tablet Commonly known as:  PERCOCET/ROXICET Take 1-2 tablets by mouth every 4 (four) hours as needed (moderate to severe pain (when tolerating fluids)).   SUMAtriptan  100 MG tablet Commonly known as:  IMITREX Take 100 mg by mouth every 2 (two) hours as needed (for migraine headaches (2 doses/24 hr)).   topiramate 50 MG tablet Commonly known as:  TOPAMAX Take 50 mg by mouth daily with breakfast.            Discharge Care Instructions        Start     Ordered   11/04/16 0000  ibuprofen (ADVIL,MOTRIN) 600 MG tablet  Every 6 hours PRN     11/04/16 1011   11/04/16 0000  oxyCODONE-acetaminophen (PERCOCET/ROXICET) 5-325 MG tablet  Every 4 hours PRN     11/04/16 1011   11/04/16 0000  Call MD for:    Comments:  Heavy vaginal bleeding greater than 1 pad an hour   11/04/16 1011   11/04/16 0000  Call MD for:  temperature >100.4     11/04/16 1011   11/04/16 0000  Call MD for:  persistant nausea and vomiting     11/04/16 1011   11/04/16 0000  Call MD for:  severe uncontrolled pain     11/04/16 1011   11/04/16 0000  Call MD for:  redness, tenderness, or signs of infection (pain, swelling, redness, odor or green/yellow discharge around incision site)     11/04/16 1011   11/04/16 0000  Call MD for:  difficulty breathing, headache or visual disturbances     11/04/16 1011   11/04/16 0000  Call MD for:  hives     11/04/16 1011   11/04/16 0000  Call MD for:  persistant dizziness or light-headedness     11/04/16 1011   11/04/16 0000  Call MD for:  extreme fatigue     11/04/16 1011   11/04/16 0000  Lifting restrictions    Comments:  Weight restriction of 10lbs for 6 weeks.   11/04/16 1011   11/04/16 0000  Driving restriction    Comments:  Avoid driving for at least 2 weeks or while taking  prescription narcotics.   11/04/16 1011   11/04/16 0000  Sexual acrtivity    Comments:  No intercourse, tampons, or anything vaginally for 6 weeks   11/04/16 1011   11/04/16 0000  Discharge wound care:    Comments:  You may apply a light dressing for minor discharge from the incision or to keep waistbands of clothing from rubbing.  You may also have been discharge with a clear dressing in which case this will be removed at your postoperative clinic visit.  You may shower, use soap on your incision.  Avoiding baths or soaking the incision in the first 6 weeks following your surgery.Marland Kitchen   11/04/16 1011   11/04/16 0000  Diet general     11/04/16 1011   11/04/16 0000  hydrOXYzine (ATARAX/VISTARIL) 25 MG tablet  Every 6 hours PRN     11/04/16 1123     Follow-up Information    Malachy Mood, MD Follow up in 1 week(s).   Specialty:  Obstetrics and Gynecology Why:  You may drop by the office on Monday to removed your pain pump catheter if needed Contact information: [address] [place] [address] [phone]           Signed: Malachy Mood 11/04/2016, 11:23 AM

## 2016-11-04 NOTE — Progress Notes (Signed)
Discharge instructions complete and prescriptions given. Patient verbalizes understanding of teaching. Patient discharged home at 1253.

## 2016-11-06 ENCOUNTER — Encounter: Payer: Self-pay | Admitting: Obstetrics and Gynecology

## 2016-11-06 ENCOUNTER — Ambulatory Visit (INDEPENDENT_AMBULATORY_CARE_PROVIDER_SITE_OTHER): Payer: BLUE CROSS/BLUE SHIELD | Admitting: Obstetrics and Gynecology

## 2016-11-06 ENCOUNTER — Telehealth: Payer: Self-pay | Admitting: Obstetrics and Gynecology

## 2016-11-06 VITALS — BP 112/70 | HR 86 | Ht 67.0 in | Wt 178.0 lb

## 2016-11-06 DIAGNOSIS — Z4889 Encounter for other specified surgical aftercare: Secondary | ICD-10-CM

## 2016-11-06 LAB — SURGICAL PATHOLOGY

## 2016-11-06 NOTE — Telephone Encounter (Signed)
AMS, please advise on this

## 2016-11-06 NOTE — Telephone Encounter (Signed)
Pt reports she is unable to remove to wound vac. Please advise overbooking

## 2016-11-06 NOTE — Telephone Encounter (Signed)
Tried calling pt on number given and voicemail has not been set up. If she calls back please let pt know she can remove pump herself. She should already have routine post op appointment scheduled. I do not need to talk to her.

## 2016-11-06 NOTE — Telephone Encounter (Signed)
Pt is calling to be seen for Post op appointment today to remove wound vac . Please advise

## 2016-11-07 NOTE — Progress Notes (Signed)
      Postoperative Follow-up Patient presents post op from abdominal myomectomy, right ovarian cystectomy 1weeks ago for uterine fibroids and right ovarian endometrioma.  Subjective: Patient reports some improvement in her preop symptoms. Eating a regular diet without difficulty. pain well controlled on percocet, ibuprofen  Activity: normal activities of daily living.  Objective: Vitals:   11/06/16 1604  BP: 112/70  Pulse: 86   Gen: NAD Pulmonary: no increased work of breathing Abdomen: soft, non-tender, non-distended, incision D/C/I, ON-Q catheters removed Ext: no edema  Assessment: 31 y.o. s/p abdominal myomectomy and right ovarian cystectomy stable  Plan: Patient has done well after surgery with no apparent complications.  I have discussed the post-operative course to date, and the expected progress moving forward.  The patient understands what complications to be concerned about.  I will see the patient in routine follow up, or sooner if needed.    Activity plan: No heavy lifting.  Malachy Mood 11/07/2016, 11:11 PM

## 2016-11-10 ENCOUNTER — Ambulatory Visit (INDEPENDENT_AMBULATORY_CARE_PROVIDER_SITE_OTHER): Payer: BLUE CROSS/BLUE SHIELD | Admitting: Obstetrics and Gynecology

## 2016-11-10 ENCOUNTER — Encounter: Payer: Self-pay | Admitting: Obstetrics and Gynecology

## 2016-11-10 VITALS — BP 108/68 | HR 113 | Ht 67.0 in | Wt 172.0 lb

## 2016-11-10 DIAGNOSIS — Z09 Encounter for follow-up examination after completed treatment for conditions other than malignant neoplasm: Secondary | ICD-10-CM

## 2016-11-10 MED ORDER — OXYCODONE-ACETAMINOPHEN 5-325 MG PO TABS: 1.0000 | ORAL_TABLET | ORAL | 0 refills | Status: DC | PRN

## 2016-11-10 MED ORDER — LIDOCAINE HCL 2 % EX GEL: 1.0000 "application " | CUTANEOUS | 2 refills | Status: DC | PRN

## 2016-11-12 NOTE — Progress Notes (Signed)
      Postoperative Follow-up Patient presents post op from abdominal myomectomy and right ovarian cystectomy 1weeks ago for uterine fibroid and right endometrioma.  Subjective: Patient reports some improvement in her preop symptoms. Eating a regular diet without difficulty. Pain is controlled with current analgesics. Medications being used: ibuprofen (OTC) and narcotic analgesics including oxycodone/acetaminophen (Percocet, Tylox).  Activity: normal activities of daily living.  Objective: Vitals:   11/10/16 1047  BP: 108/68  Pulse: (!) 113   Gen: NAD Abdomen: NABS, soft, non-tender, non-distended, D/C/I incision Ext: no edema  Assessment: 31 y.o. s/p abdominal myomectomy and right ovarian cystectomy stable  Plan: Patient has done well after surgery with no apparent complications.  I have discussed the post-operative course to date, and the expected progress moving forward.  The patient understands what complications to be concerned about.  I will see the patient in routine follow up, or sooner if needed.    Activity plan: No heavy lifting. - refill percocet and lidocaine  Malachy Mood 11/12/2016, 8:47 PM

## 2016-11-21 ENCOUNTER — Encounter: Payer: Self-pay | Admitting: Obstetrics and Gynecology

## 2016-12-12 ENCOUNTER — Telehealth: Payer: Self-pay | Admitting: Obstetrics and Gynecology

## 2016-12-12 NOTE — Telephone Encounter (Signed)
Called and leftvoicemail for patient to call back to be schedule for [facility] on 03/15/16 and follow up with AMS

## 2016-12-18 ENCOUNTER — Encounter: Payer: Self-pay | Admitting: Obstetrics and Gynecology

## 2016-12-18 ENCOUNTER — Ambulatory Visit (INDEPENDENT_AMBULATORY_CARE_PROVIDER_SITE_OTHER): Payer: BLUE CROSS/BLUE SHIELD | Admitting: Obstetrics and Gynecology

## 2016-12-18 VITALS — BP 108/74 | HR 127 | Ht 67.0 in | Wt 166.0 lb

## 2016-12-18 DIAGNOSIS — Z4889 Encounter for other specified surgical aftercare: Secondary | ICD-10-CM

## 2016-12-18 MED ORDER — MEDROXYPROGESTERONE ACETATE 150 MG/ML IM SUSP: 150.0000 mg | INTRAMUSCULAR | 3 refills | Status: DC

## 2016-12-18 MED ORDER — LIDOCAINE-PRILOCAINE 2.5-2.5 % EX CREA: 1.0000 "application " | TOPICAL_CREAM | CUTANEOUS | 0 refills | Status: DC | PRN

## 2016-12-18 NOTE — Progress Notes (Signed)
      Postoperative Follow-up Patient presents post op from myomectomy via laparotomy, right ovarian cystectomy (endometrioma) 6weeks ago for pelvic pain and right ovarian endometrioma, and uterine fibroids.  Subjective: Patient reports marked improvement in her preop symptoms. Eating a regular diet without difficulty. The patient is not having any pain.  Activity: normal activities of daily living.  Objective: Vitals:   12/18/16 1038  BP: 108/74  Pulse: (!) 127   Gen: NAD Abdomen: soft, non-tender, non-distended, incision D/C/I GU: uterus non-enlarged, non-tender, mobile Ext: no edema  Assessment: 31 y.o. s/p myomectomy and right ovarian cystectomy stable  Plan: Patient has done well after surgery with no apparent complications.  I have discussed the post-operative course to date, and the expected progress moving forward.  The patient understands what complications to be concerned about.  I will see the patient in routine follow up, or sooner if needed.    Activity plan: No restriction.  - Continue depo provera for endometriosis until ready to conceive  Malachy Mood 12/18/2016, 10:44 PM

## 2016-12-19 ENCOUNTER — Other Ambulatory Visit: Payer: Self-pay | Admitting: Obstetrics and Gynecology

## 2016-12-19 ENCOUNTER — Telehealth: Payer: Self-pay

## 2016-12-19 MED ORDER — NORETHINDRONE ACETATE 5 MG PO TABS: 5.0000 mg | ORAL_TABLET | Freq: Every day | ORAL | 11 refills | Status: DC

## 2016-12-19 NOTE — Telephone Encounter (Signed)
Another FMLA/DISABILITY form for [employer] filled out and given to [place] for processing.

## 2016-12-19 NOTE — Telephone Encounter (Signed)
FMLA/DISABILITY form for [employer] filled out and given to [place] for processing.

## 2016-12-30 ENCOUNTER — Encounter: Payer: Self-pay | Admitting: Obstetrics and Gynecology

## 2017-01-03 ENCOUNTER — Encounter: Payer: Self-pay | Admitting: Obstetrics and Gynecology

## 2017-01-04 ENCOUNTER — Telehealth: Payer: Self-pay | Admitting: Obstetrics and Gynecology

## 2017-01-04 ENCOUNTER — Encounter: Payer: Self-pay | Admitting: Obstetrics and Gynecology

## 2017-01-04 NOTE — Telephone Encounter (Signed)
Patient had surgery 2 months ago, and is having bad pain where the cyst was removed. Has taken NSAIDS for the pain but they are not helping. Patient would like to know if AMS will write a prescription? Or, what should she do? Patient said she left a message on the nurse line but hasn't received a call back, and feels like her only other option is the ER. Patient is aware AMS is in the OR today but would like a nurse to call back.

## 2017-01-04 NOTE — Telephone Encounter (Signed)
Left detailed msg that if in that much pain she should go to the ED.  However, in addition to the NSAIDS she may apply heat to the area 20 min each hour.  Adv I would forward msg to AMS. [phone]

## 2017-01-18 ENCOUNTER — Other Ambulatory Visit: Payer: Self-pay | Admitting: Obstetrics and Gynecology

## 2017-01-18 ENCOUNTER — Encounter: Payer: Self-pay | Admitting: Obstetrics and Gynecology

## 2017-01-18 MED ORDER — VALACYCLOVIR HCL 500 MG PO TABS: 500.0000 mg | ORAL_TABLET | Freq: Two times a day (BID) | ORAL | 6 refills | Status: DC

## 2017-01-22 ENCOUNTER — Ambulatory Visit: Payer: BLUE CROSS/BLUE SHIELD

## 2017-02-09 ENCOUNTER — Ambulatory Visit: Payer: BLUE CROSS/BLUE SHIELD | Admitting: Obstetrics & Gynecology

## 2017-02-09 ENCOUNTER — Ambulatory Visit (INDEPENDENT_AMBULATORY_CARE_PROVIDER_SITE_OTHER): Payer: BLUE CROSS/BLUE SHIELD | Admitting: Obstetrics and Gynecology

## 2017-02-09 ENCOUNTER — Encounter: Payer: Self-pay | Admitting: Obstetrics and Gynecology

## 2017-02-09 VITALS — BP 110/84 | HR 115 | Ht 67.0 in | Wt 167.0 lb

## 2017-02-09 DIAGNOSIS — Z9889 Other specified postprocedural states: Secondary | ICD-10-CM

## 2017-02-09 DIAGNOSIS — R102 Pelvic and perineal pain: Secondary | ICD-10-CM

## 2017-02-09 DIAGNOSIS — N809 Endometriosis, unspecified: Secondary | ICD-10-CM

## 2017-02-09 MED ORDER — HYDROCODONE-ACETAMINOPHEN 5-325 MG PO TABS: 1.0000 | ORAL_TABLET | Freq: Four times a day (QID) | ORAL | 0 refills | Status: DC | PRN

## 2017-02-09 NOTE — Progress Notes (Signed)
Obstetrics & Gynecology Office Visit   Chief Complaint:  Chief Complaint  Patient presents with  . Pelvic Pain    Pain is getting sporadic since September surgery  . Post-op Problem    History of Present Illness: 32 year old G2P0020 status post abdominal myomectomy and right ovarian cystectomy for uterine leiomyomata and endometrioma.  Her largest fibroid was right lateral, and did at least partial go into the endometrial cavity.  She has continued to have intermittent right lower quadrant pain since the procedure.  She is not having any bleeding concerns, currently on norethindrone for cycle suppression secondary to endometriosis.  She states her pain is right sided to midline, it is positional and worse after standing or sitting for long periods of time. She does report night time vasomotor symptoms and waking up with night sweats but has not noted any actual temperature elevations. There is some radiation into groin and lower back.  No associated vaginal discharge.     Review of Systems: Review of Systems  Constitutional: Positive for diaphoresis. Negative for chills, fever, malaise/fatigue and weight loss.  Gastrointestinal: Positive for abdominal pain. Negative for constipation, diarrhea and nausea.  Genitourinary: Negative for dysuria, frequency and urgency.  Neurological: Negative for dizziness.     Past Medical History:  Past Medical History:  Diagnosis Date  . Anxiety   . Asthma   . Concussion   . Depression   . Hypertension   . Migraines     Past Surgical History:  Past Surgical History:  Procedure Laterality Date  . LAPAROTOMY N/A 11/02/2016   Procedure: LAPAROTOMY;  Surgeon: Malachy Mood, MD;  Location: [facility] ORS;  Service: Gynecology;  Laterality: N/A;  . 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 MYOMECTOMY N/A 11/02/2016   Procedure: MYOMECTOMY;  Surgeon: Malachy Mood, MD;  Location: [facility] ORS;  Service: Gynecology;  Laterality: N/A;  . OVARIAN CYST REMOVAL Right  11/02/2016   Procedure: OVARIAN CYSTECTOMY;  Surgeon: Malachy Mood, MD;  Location: [facility] ORS;  Service: Gynecology;  Laterality: Right;  . TONSILLECTOMY    . TONSILLECTOMY      Gynecologic History: No LMP recorded. Patient is not currently having periods (Reason: Oral contraceptives).  Obstetric History: G2P0020  Family History:  Family History  Problem Relation Age of Onset  . Breast cancer Maternal Aunt        [employer]  . Breast cancer Paternal Aunt 80  . Uterine cancer Maternal Aunt   . Breast cancer Paternal 34        Twin half sisters of dad    Social History:  Social History   Socioeconomic History  . Marital status: Single    Spouse name: Not on file  . Number of children: Not on file  . Years of education: Not on file  . Highest education level: Not on file  Social Needs  . Financial resource strain: Not on file  . Food insecurity - worry: Not on file  . Food insecurity - inability: Not on file  . Transportation needs - medical: Not on file  . Transportation needs - non-medical: Not on file  Occupational History  . Not on file  Tobacco Use  . Smoking status: Current Every Day Smoker    Packs/day: 0.25    Types: Cigarettes  . Smokeless tobacco: Never Used  Substance and Sexual Activity  . Alcohol use: Yes    Alcohol/week: 1.2 oz    Types: 2 Cans of beer per week  Comment: weekend  . Drug use: No  . Sexual activity: Yes    Birth control/protection: Injection  Other Topics Concern  . Not on file  Social History Narrative  . Not on file    Allergies:  Allergies  Allergen Reactions  . Benadryl [Diphenhydramine Hcl (Sleep)] Swelling  . Latex Itching and Swelling  . Ultram [Tramadol Hcl] Itching    Medications: Prior to Admission medications   Medication Sig Start Date End Date Taking? Authorizing Provider  albuterol (PROVENTIL HFA;VENTOLIN HFA) 108 (90 Base) MCG/ACT inhaler Inhale 2 puffs into the lungs every 6 (six) hours as needed (for  wheezing/shortness of breath).    Yes [provider]  citalopram (CELEXA) 20 MG tablet Take 20 mg by mouth daily with breakfast.   Yes [provider]  cyclobenzaprine (FLEXERIL) 10 MG tablet Take 10 mg by mouth 3 (three) times daily as needed (for muscle spasms).  02/29/16  Yes [provider]  hydrochlorothiazide (MICROZIDE) 12.5 MG capsule Take 12.5 mg by mouth daily with breakfast.  02/29/16  Yes [provider]  lidocaine-prilocaine (EMLA) cream Apply 1 application as needed topically. 12/18/16  Yes Malachy Mood, MD  norethindrone (AYGESTIN) 5 MG tablet Take 1 tablet (5 mg total) daily by mouth. 12/19/16  Yes Malachy Mood, MD  OLIVE LEAF PO Take 750 mg by mouth 4 (four) times daily.   Yes [provider]  SUMAtriptan (IMITREX) 100 MG tablet Take 100 mg by mouth every 2 (two) hours as needed (for migraine headaches (2 doses/24 hr)).  06/14/15  Yes [provider]  topiramate (TOPAMAX) 50 MG tablet Take 50 mg by mouth daily with breakfast.  03/28/16  Yes [provider]  HYDROcodone-acetaminophen (NORCO/VICODIN) 5-325 MG tablet Take 1 tablet by mouth every 6 (six) hours as needed. 02/09/17   Malachy Mood, MD    Physical Exam Vitals:  Vitals:   02/09/17 1138  BP: 110/84  Pulse: (!) 115   No LMP recorded. Patient is not currently having periods (Reason: Oral contraceptives).  General: NAD HEENT: normocephalic, anicteric Thyroid: no enlargement, no palpable nodules Pulmonary: No increased work of breathing Abdomen: NABS, soft, non-tender, non-distended.  Umbilicus without lesions.  No hepatomegaly, splenomegaly or masses palpable. No evidence of hernia, abdominal incision well healed  Genitourinary:  External: Normal external female genitalia.  Normal urethral meatus, normal Bartholin's and Skene's glands.    Vagina: Normal vaginal mucosa, no evidence of prolapse.    Cervix: Grossly normal in appearance, no  bleeding  Uterus: Non-enlarged, mobile, normal contour.  No CMT  Adnexa: ovaries non-enlarged, no adnexal masses  Rectal: deferred  Lymphatic: no evidence of inguinal lymphadenopathy Extremities: no edema, erythema, or tenderness Neurologic: Grossly intact Psychiatric: mood appropriate, affect full  Female chaperone present for pelvic and breast  portions of the physical exam  Assessment: 32 y.o. G2P0020 acute exacerbation of chronic pelvic pain  Plan: Problem List Items Addressed This Visit    None    Visit Diagnoses    Pelvic pain in female    -  Primary   Relevant Orders   [place] Sonohysterogram   CBC with Differential   History of myomectomy       Relevant Orders   [place] Sonohysterogram   Endometriosis determined by laparoscopy       Relevant Orders   [place] Sonohysterogram     - Unlikley infectious etiology 3 months postoperatively.  However, I would consider empiric trial of Doxycline to cover for endometritis if symptoms persist and  imaging is normal.  CBC obtained today.  Exam was negative for CMT, the uterus was mobile.  This could also represent exacerbation of endometriosis and I discussed adding a [place] agonist or antagonist, +/-  gabapentin.   - Follow up next available for SIS to evaluate uterine cavity following her myomectomy to rule out scarring -Return in about 1 week (around 02/16/2017) for Sonohysterogram in the next week.

## 2017-02-10 ENCOUNTER — Encounter: Payer: Self-pay | Admitting: Obstetrics and Gynecology

## 2017-02-10 LAB — CBC WITH DIFFERENTIAL/PLATELET
BASOS: 0 %
Basophils Absolute: 0 10*3/uL (ref 0.0–0.2)
EOS (ABSOLUTE): 0.1 10*3/uL (ref 0.0–0.4)
Eos: 1 %
HEMATOCRIT: 35.1 % (ref 34.0–46.6)
HEMOGLOBIN: 10.6 g/dL — AB (ref 11.1–15.9)
Immature Grans (Abs): 0 10*3/uL (ref 0.0–0.1)
Immature Granulocytes: 0 %
LYMPHS ABS: 3.1 10*3/uL (ref 0.7–3.1)
LYMPHS: 42 %
MCH: 23.1 pg — AB (ref 26.6–33.0)
MCHC: 30.2 g/dL — AB (ref 31.5–35.7)
MCV: 77 fL — AB (ref 79–97)
MONOS ABS: 0.5 10*3/uL (ref 0.1–0.9)
Monocytes: 6 %
Neutrophils Absolute: 3.7 10*3/uL (ref 1.4–7.0)
Neutrophils: 51 %
Platelets: 329 10*3/uL (ref 150–379)
RBC: 4.58 x10E6/uL (ref 3.77–5.28)
RDW: 19.1 % — AB (ref 12.3–15.4)
WBC: 7.4 10*3/uL (ref 3.4–10.8)

## 2017-02-12 ENCOUNTER — Encounter: Payer: Self-pay | Admitting: Obstetrics and Gynecology

## 2017-02-12 ENCOUNTER — Other Ambulatory Visit: Payer: Self-pay | Admitting: Obstetrics and Gynecology

## 2017-02-12 ENCOUNTER — Other Ambulatory Visit: Payer: Self-pay

## 2017-02-12 MED ORDER — VALACYCLOVIR HCL 500 MG PO TABS: 500.0000 mg | ORAL_TABLET | Freq: Two times a day (BID) | ORAL | 6 refills | Status: AC

## 2017-02-12 MED ORDER — VALACYCLOVIR HCL 500 MG PO TABS: 500.0000 mg | ORAL_TABLET | Freq: Two times a day (BID) | ORAL | 6 refills | Status: DC

## 2017-02-16 ENCOUNTER — Ambulatory Visit (INDEPENDENT_AMBULATORY_CARE_PROVIDER_SITE_OTHER): Payer: BLUE CROSS/BLUE SHIELD | Admitting: Obstetrics and Gynecology

## 2017-02-16 ENCOUNTER — Ambulatory Visit (INDEPENDENT_AMBULATORY_CARE_PROVIDER_SITE_OTHER): Payer: BLUE CROSS/BLUE SHIELD

## 2017-02-16 DIAGNOSIS — N809 Endometriosis, unspecified: Secondary | ICD-10-CM | POA: Diagnosis not present

## 2017-02-16 DIAGNOSIS — G8929 Other chronic pain: Secondary | ICD-10-CM

## 2017-02-16 DIAGNOSIS — N83201 Unspecified ovarian cyst, right side: Secondary | ICD-10-CM | POA: Diagnosis not present

## 2017-02-16 DIAGNOSIS — R102 Pelvic and perineal pain: Secondary | ICD-10-CM

## 2017-02-16 DIAGNOSIS — Z9889 Other specified postprocedural states: Secondary | ICD-10-CM | POA: Diagnosis not present

## 2017-02-16 MED ORDER — GABAPENTIN 300 MG PO CAPS: ORAL_CAPSULE | ORAL | 6 refills | Status: DC

## 2017-02-16 NOTE — Patient Instructions (Addendum)
Options: 1) add neurontin (gabapentin) for pain 2) start Lupron (6 months) or Orilissa (1 year) 3) if no improvement diagnostic laparoscopy and hysteroscopy to look at right ovary and uterus and evaluate for any scar tissue

## 2017-02-16 NOTE — Progress Notes (Signed)
Gynecology Ultrasound Follow Up  Chief Complaint: No chief complaint on file.    History of Present Illness: Patient is a 32 y.o. female who presents today for ultrasound evaluation of right pelvic pain.  Ultrasound demonstrates the following findgins Adnexa: normal consistency, no evidence of recurrence of endometrioma on right ovary Uterus: Normal contour. The cavity easily cannulated for the SIS and distended readily. There were no obvious filling defect one area did appear to have a possible small synechia   Additional: no free fluid  Labs from last visit reviewed with patient and normal.  Review of Systems: Review of Systems  Constitutional: Negative for chills and fever.  Gastrointestinal: Positive for abdominal pain. Negative for constipation, diarrhea, nausea and vomiting.  Genitourinary: Negative for dysuria, frequency and urgency.    Past Medical History:  Past Medical History:  Diagnosis Date  . Anxiety   . Asthma   . Concussion   . Depression   . Hypertension   . Migraines     Past Surgical History:  Past Surgical History:  Procedure Laterality Date  . LAPAROTOMY N/A 11/02/2016   Procedure: LAPAROTOMY;  Surgeon: Malachy Mood, MD;  Location: [facility];  Service: Gynecology;  Laterality: N/A;  . 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 MYOMECTOMY N/A 11/02/2016   Procedure: MYOMECTOMY;  Surgeon: Malachy Mood, MD;  Location: [facility] ORS;  Service: Gynecology;  Laterality: N/A;  . OVARIAN CYST REMOVAL Right 11/02/2016   Procedure: OVARIAN CYSTECTOMY;  Surgeon: Malachy Mood, MD;  Location: [facility] ORS;  Service: Gynecology;  Laterality: Right;  . TONSILLECTOMY    . TONSILLECTOMY      Gynecologic History:  No LMP recorded. Patient is not currently having periods (Reason: Oral contraceptives).  Family History:  Family History  Problem Relation Age of Onset  . Breast cancer Maternal Aunt        [employer]  . Breast cancer Paternal Aunt 55  . Uterine cancer  Maternal Aunt   . Breast cancer Paternal 10        Twin half sisters of dad    Social History:  Social History   Socioeconomic History  . Marital status: Single    Spouse name: Not on file  . Number of children: Not on file  . Years of education: Not on file  . Highest education level: Not on file  Social Needs  . Financial resource strain: Not on file  . Food insecurity - worry: Not on file  . Food insecurity - inability: Not on file  . Transportation needs - medical: Not on file  . Transportation needs - non-medical: Not on file  Occupational History  . Not on file  Tobacco Use  . Smoking status: Current Every Day Smoker    Packs/day: 0.25    Types: Cigarettes  . Smokeless tobacco: Never Used  Substance and Sexual Activity  . Alcohol use: Yes    Alcohol/week: 1.2 oz    Types: 2 Cans of beer per week    Comment: weekend  . Drug use: No  . Sexual activity: Yes    Birth control/protection: Injection  Other Topics Concern  . Not on file  Social History Narrative  . Not on file    Allergies:  Allergies  Allergen Reactions  . Benadryl [Diphenhydramine Hcl (Sleep)] Swelling  . Latex Itching and Swelling  . Ultram [Tramadol Hcl] Itching    Medications: Prior to Admission medications   Medication Sig Start Date End Date Taking? Authorizing Provider  albuterol (PROVENTIL HFA;VENTOLIN HFA) 108 (90 Base) MCG/ACT inhaler Inhale 2 puffs into the lungs every 6 (six) hours as needed (for wheezing/shortness of breath).     [provider]  citalopram (CELEXA) 20 MG tablet Take 20 mg by mouth daily with breakfast.    [provider]  cyclobenzaprine (FLEXERIL) 10 MG tablet Take 10 mg by mouth 3 (three) times daily as needed (for muscle spasms).  02/29/16   [provider]  gabapentin (NEURONTIN) 300 MG capsule 1 tablet po once daily x 1 week, 1 tablet po bid x 1 weel, then 1 tablet po tid maintenance 02/16/17   Malachy Mood, MD    hydrochlorothiazide (MICROZIDE) 12.5 MG capsule Take 12.5 mg by mouth daily with breakfast.  02/29/16   [provider]  HYDROcodone-acetaminophen (NORCO/VICODIN) 5-325 MG tablet Take 1 tablet by mouth every 6 (six) hours as needed. 02/09/17   Malachy Mood, MD  lidocaine-prilocaine (EMLA) cream Apply 1 application as needed topically. 12/18/16   Malachy Mood, MD  norethindrone (AYGESTIN) 5 MG tablet Take 1 tablet (5 mg total) daily by mouth. 12/19/16   Malachy Mood, MD  OLIVE LEAF PO Take 750 mg by mouth 4 (four) times daily.    [provider]  SUMAtriptan (IMITREX) 100 MG tablet Take 100 mg by mouth every 2 (two) hours as needed (for migraine headaches (2 doses/24 hr)).  06/14/15   [provider]  topiramate (TOPAMAX) 50 MG tablet Take 50 mg by mouth daily with breakfast.  03/28/16   [provider]    Physical Exam There were no vitals taken for this visit.  General: NAD HEENT: normocephalic, anicteric Pulmonary: No increased work of breathing Extremities: no edema, erythema, or tenderness Neurologic: Grossly intact, normal gait Psychiatric: mood appropriate, affect full   Assessment: 32 y.o. G2P0020 chronic pelvic pain s/p prior myomectomy  Plan: Problem List Items Addressed This Visit      Other   Endometriosis determined by laparoscopy    Other Visit Diagnoses    Chronic pelvic pain in female    -  Primary   Relevant Medications   gabapentin (NEURONTIN) 300 MG capsule      1) Start gabapentin on top of norethindrone.  Given the imaging findings and labs I feel the most likely etiologies for patients symptoms are endometriosis exacerbation or scar tissue from myomectomy.  We will assess treatment response at next visit, titrate gabapentin as needed.  We also discussed adding Lupron.  Should she fail to respond to increased medical therapy we did discuss diagnostic laparoscopy  2) A total of 15 minutes were spent in face-to-face  contact with the patient during this encounter with over half of that time devoted to counseling and coordination of care.

## 2017-02-19 DIAGNOSIS — N809 Endometriosis, unspecified: Secondary | ICD-10-CM | POA: Insufficient documentation

## 2017-02-23 ENCOUNTER — Other Ambulatory Visit: Payer: Self-pay | Admitting: Obstetrics and Gynecology

## 2017-02-27 ENCOUNTER — Telehealth: Payer: Self-pay

## 2017-02-27 NOTE — Telephone Encounter (Signed)
FMLA/DISABILITY for for [employer] filled out. Signature obtained, and given to [place] for processing.

## 2017-03-04 ENCOUNTER — Encounter: Payer: Self-pay | Admitting: Obstetrics and Gynecology

## 2017-03-06 ENCOUNTER — Telehealth: Payer: Self-pay | Admitting: Obstetrics and Gynecology

## 2017-03-06 ENCOUNTER — Other Ambulatory Visit: Payer: Self-pay | Admitting: Obstetrics and Gynecology

## 2017-03-06 MED ORDER — NAPROXEN 500 MG PO TABS: 500.0000 mg | ORAL_TABLET | Freq: Two times a day (BID) | ORAL | 2 refills | Status: DC

## 2017-03-06 NOTE — Telephone Encounter (Signed)
Patient calling stating she has sent a msg to AMS but hadn't heard back, wanted to make sure he did get the msg, aware AMS not in office today.

## 2017-03-12 ENCOUNTER — Encounter: Payer: Self-pay | Admitting: Obstetrics and Gynecology

## 2017-04-16 ENCOUNTER — Ambulatory Visit (INDEPENDENT_AMBULATORY_CARE_PROVIDER_SITE_OTHER): Payer: BLUE CROSS/BLUE SHIELD | Admitting: Obstetrics and Gynecology

## 2017-04-16 ENCOUNTER — Encounter: Payer: Self-pay | Admitting: Obstetrics and Gynecology

## 2017-04-16 VITALS — BP 112/80 | HR 116 | Ht 67.0 in | Wt 174.0 lb

## 2017-04-16 DIAGNOSIS — R102 Pelvic and perineal pain: Secondary | ICD-10-CM | POA: Diagnosis not present

## 2017-04-16 DIAGNOSIS — G8929 Other chronic pain: Secondary | ICD-10-CM | POA: Diagnosis not present

## 2017-04-16 DIAGNOSIS — N809 Endometriosis, unspecified: Secondary | ICD-10-CM

## 2017-04-16 MED ORDER — NORETHINDRONE ACETATE 5 MG PO TABS: 5.0000 mg | ORAL_TABLET | Freq: Every day | ORAL | 11 refills | Status: DC

## 2017-04-16 MED ORDER — GABAPENTIN 300 MG PO CAPS
ORAL_CAPSULE | ORAL | 6 refills | Status: DC
Start: 2017-04-16 — End: 2017-04-18

## 2017-04-18 MED ORDER — GABAPENTIN 300 MG PO CAPS: 300.0000 mg | ORAL_CAPSULE | Freq: Three times a day (TID) | ORAL | 6 refills | Status: DC

## 2017-04-18 NOTE — Progress Notes (Signed)
Obstetrics & Gynecology Office Visit   Chief Complaint:  Chief Complaint  Patient presents with  . Medication follow up    History of Present Illness: 32 y.o. G2P0020 presenting for medication follow up. The etiology of her pelvic pain is laparoscopically proven at the time of prior myomectomy.  She is currently being managed with NSAIDs, norethindrone and gabapentin.   The patient reports good control of symptoms on her current regimen.  On her current medication regimen the patient has achieved amenorrhea.   She has not noted any side-effects or new symptoms.  She has previously tried NSAIDs, narcotics, norethindrone, gabapentin and continous OCP.   Review of Systems: Review of Systems  Constitutional: Negative for chills and fever.  HENT: Negative for congestion.   Respiratory: Negative for cough and shortness of breath.   Cardiovascular: Negative for chest pain and palpitations.  Gastrointestinal: Negative for abdominal pain, constipation, diarrhea, heartburn, nausea and vomiting.  Genitourinary: Negative for dysuria, frequency and urgency.  Skin: Negative for itching and rash.  Neurological: Negative for dizziness and headaches.  Endo/Heme/Allergies: Negative for polydipsia.  Psychiatric/Behavioral: Negative for depression.     Past Medical History:  Past Medical History:  Diagnosis Date  . Anxiety   . Asthma   . Concussion   . Depression   . Hypertension   . Migraines     Past Surgical History:  Past Surgical History:  Procedure Laterality Date  . LAPAROTOMY N/A 11/02/2016   Procedure: LAPAROTOMY;  Surgeon: Malachy Mood, MD;  Location: [facility];  Service: Gynecology;  Laterality: N/A;  . 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 MYOMECTOMY N/A 11/02/2016   Procedure: MYOMECTOMY;  Surgeon: Malachy Mood, MD;  Location: [facility];  Service: Gynecology;  Laterality: N/A;  . OVARIAN CYST REMOVAL Right 11/02/2016   Procedure: OVARIAN CYSTECTOMY;  Surgeon: Malachy Mood,  MD;  Location: [facility];  Service: Gynecology;  Laterality: Right;  . TONSILLECTOMY    . TONSILLECTOMY      Gynecologic History: No LMP recorded. Patient is not currently having periods (Reason: Oral contraceptives).  Obstetric History: G2P0020  Family History:  Family History  Problem Relation Age of Onset  . Breast cancer Maternal Aunt        [employer]  . Breast cancer Paternal Aunt 59  . Uterine cancer Maternal Aunt   . Breast cancer Paternal 71        Twin half sisters of dad    Social History:  Social History   Socioeconomic History  . Marital status: Single    Spouse name: Not on file  . Number of children: Not on file  . Years of education: Not on file  . Highest education level: Not on file  Social Needs  . Financial resource strain: Not on file  . Food insecurity - worry: Not on file  . Food insecurity - inability: Not on file  . Transportation needs - medical: Not on file  . Transportation needs - non-medical: Not on file  Occupational History  . Not on file  Tobacco Use  . Smoking status: Current Every Day Smoker    Packs/day: 0.25    Types: Cigarettes  . Smokeless tobacco: Never Used  Substance and Sexual Activity  . Alcohol use: Yes    Alcohol/week: 1.2 oz    Types: 2 Cans of beer per week    Comment: weekend  . Drug use: No  . Sexual activity: Yes    Birth control/protection: Injection  Other  Topics Concern  . Not on file  Social History Narrative  . Not on file    Allergies:  Allergies  Allergen Reactions  . Benadryl [Diphenhydramine Hcl (Sleep)] Swelling  . Latex Itching and Swelling  . Ultram [Tramadol Hcl] Itching    Medications: Prior to Admission medications   Medication Sig Start Date End Date Taking? Authorizing Provider  albuterol (PROVENTIL HFA;VENTOLIN HFA) 108 (90 Base) MCG/ACT inhaler Inhale 2 puffs into the lungs every 6 (six) hours as needed (for wheezing/shortness of breath).    Yes [provider]    citalopram (CELEXA) 20 MG tablet Take 20 mg by mouth daily with breakfast.   Yes [provider]  cyclobenzaprine (FLEXERIL) 10 MG tablet Take 10 mg by mouth 3 (three) times daily as needed (for muscle spasms).  02/29/16  Yes [provider]  gabapentin (NEURONTIN) 300 MG capsule 1 tablet po once daily x 1 week, 1 tablet po bid x 1 weel, then 1 tablet po tid maintenance 04/16/17  Yes Malachy Mood, MD  hydrochlorothiazide (MICROZIDE) 12.5 MG capsule Take 12.5 mg by mouth daily with breakfast.  02/29/16  Yes [provider]  lidocaine-prilocaine (EMLA) cream APPLY 1 APPLICATION AS NEEDED TOPICALLY. 02/24/17  Yes Malachy Mood, MD  naproxen (NAPROSYN) 500 MG tablet Take 1 tablet (500 mg total) by mouth 2 (two) times daily with a meal. As needed for pain 03/06/17  Yes Malachy Mood, MD  norethindrone (AYGESTIN) 5 MG tablet Take 1 tablet (5 mg total) by mouth daily. 04/16/17  Yes Malachy Mood, MD  OLIVE LEAF PO Take 750 mg by mouth 4 (four) times daily.   Yes [provider]  rizatriptan (MAXALT) 10 MG tablet  04/06/17  Yes [provider]  SUMAtriptan (IMITREX) 100 MG tablet Take 100 mg by mouth every 2 (two) hours as needed (for migraine headaches (2 doses/24 hr)).  06/14/15  Yes [provider]  topiramate (TOPAMAX) 50 MG tablet Take 50 mg by mouth daily with breakfast.  03/28/16  Yes [provider]    Physical Exam Vitals:  Vitals:   04/16/17 1556  BP: 112/80  Pulse: (!) 116   No LMP recorded. Patient is not currently having periods (Reason: Oral contraceptives).  General: NAD HEENT: normocephalic, anicteric Pulmonary: No increased work of breathing Extremities: no edema, erythema, or tenderness Neurologic: Grossly intact Psychiatric: mood appropriate, affect full  Female chaperone present for pelvic  portions of the physical exam  Assessment: 32 y.o. G2P0020 follow up chronic pelvic pain,  endometriosis  Plan: Problem List Items Addressed This Visit      Other   Endometriosis determined by laparoscopy - Primary    Other Visit Diagnoses    Chronic pelvic pain in female       Relevant Medications   gabapentin (NEURONTIN) 300 MG capsule     - continued current regimen, refill neurontin at maintenance dose of 300mg  tid - continue norethindrone - Return in about 7 months (around 11/16/2017) for [place].   Malachy Mood, MD, [facility], [facility] 04/18/2017, 3:26 AM

## 2017-04-26 ENCOUNTER — Encounter: Payer: Self-pay | Admitting: Obstetrics and Gynecology

## 2017-05-18 ENCOUNTER — Encounter: Payer: Self-pay | Admitting: Obstetrics and Gynecology

## 2017-05-22 ENCOUNTER — Encounter: Payer: Self-pay | Admitting: Obstetrics and Gynecology

## 2017-05-22 ENCOUNTER — Ambulatory Visit (INDEPENDENT_AMBULATORY_CARE_PROVIDER_SITE_OTHER): Payer: BLUE CROSS/BLUE SHIELD | Admitting: Obstetrics and Gynecology

## 2017-05-22 VITALS — BP 130/84 | HR 103 | Ht 68.0 in | Wt 178.0 lb

## 2017-05-22 DIAGNOSIS — N939 Abnormal uterine and vaginal bleeding, unspecified: Secondary | ICD-10-CM

## 2017-05-22 DIAGNOSIS — N809 Endometriosis, unspecified: Secondary | ICD-10-CM | POA: Diagnosis not present

## 2017-05-22 NOTE — Progress Notes (Signed)
Obstetrics & Gynecology Office Visit   Chief Complaint:  Chief Complaint  Patient presents with  . Follow-up    Vag Abnormal bleeding    History of Present Illness: Patient presents for follow up of abnormal vaginal bleeding.  She is status post abdominal myomectomy and right ovarian cystectomy for uterine fibroids and endometriosis.  Her postoperative course was slow in regard to pain, but laboratory evaluations were normal.  She has also undergone an SIS showing normal endometrial cavity.  She was started norethindrone for her endometriosis.  Did eventually note improvement in overall pain but began experiencing breakthrough bleeding and cramping.     Review of Systems: negative unless otherwise noted in HPI  Past Medical History:  Past Medical History:  Diagnosis Date  . Anxiety   . Asthma   . Concussion   . Depression   . Hypertension   . Migraines     Past Surgical History:  Past Surgical History:  Procedure Laterality Date  . LAPAROTOMY N/A 11/02/2016   Procedure: LAPAROTOMY;  Surgeon: Malachy Mood, MD;  Location: [facility];  Service: Gynecology;  Laterality: N/A;  . 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 MYOMECTOMY N/A 11/02/2016   Procedure: MYOMECTOMY;  Surgeon: Malachy Mood, MD;  Location: [facility];  Service: Gynecology;  Laterality: N/A;  . OVARIAN CYST REMOVAL Right 11/02/2016   Procedure: OVARIAN CYSTECTOMY;  Surgeon: Malachy Mood, MD;  Location: [facility];  Service: Gynecology;  Laterality: Right;  . TONSILLECTOMY    . TONSILLECTOMY      Gynecologic History: Patient's last menstrual period was 04/26/2017 (approximate).  Obstetric History: G2P0020  Family History:  Family History  Problem Relation Age of Onset  . Breast cancer Maternal Aunt        [employer]  . Breast cancer Paternal Aunt 37  . Uterine cancer Maternal Aunt   . Breast cancer Paternal 44        Twin half sisters of dad    Social History:  Social History   Socioeconomic  History  . Marital status: Single    Spouse name: Not on file  . Number of children: Not on file  . Years of education: Not on file  . Highest education level: Not on file  Occupational History  . Not on file  Social Needs  . Financial resource strain: Not on file  . Food insecurity:    Worry: Not on file    Inability: Not on file  . Transportation needs:    Medical: Not on file    Non-medical: Not on file  Tobacco Use  . Smoking status: Current Every Day Smoker    Packs/day: 0.25    Types: Cigarettes  . Smokeless tobacco: Never Used  Substance and Sexual Activity  . Alcohol use: Yes    Alcohol/week: 1.2 oz    Types: 2 Cans of beer per week    Comment: weekend  . Drug use: No  . Sexual activity: Yes    Birth control/protection: Injection  Lifestyle  . Physical activity:    Days per week: 0 days    Minutes per session: 0 min  . Stress: Very much  Relationships  . Social connections:    Talks on phone: Twice a week    Gets together: Once a week    Attends religious service: Never    Active member of club or organization: No    Attends meetings of clubs or organizations: Never    Relationship status: Living  with partner  . Intimate partner violence:    Fear of current or ex partner: No    Emotionally abused: No    Physically abused: No    Forced sexual activity: No  Other Topics Concern  . Not on file  Social History Narrative  . Not on file    Allergies:  Allergies  Allergen Reactions  . Benadryl [Diphenhydramine Hcl (Sleep)] Swelling  . Latex Itching and Swelling  . Ultram [Tramadol Hcl] Itching    Medications: Prior to Admission medications   Medication Sig Start Date End Date Taking? Authorizing Provider  citalopram (CELEXA) 20 MG tablet Take by mouth.   Yes [provider]  cyclobenzaprine (FLEXERIL) 10 MG tablet Take 10 mg by mouth 3 (three) times daily as needed (for muscle spasms).  02/29/16  Yes [provider]    hydrochlorothiazide (HYDRODIURIL) 12.5 MG tablet Take by mouth.   Yes [provider]  IBU 800 MG tablet  04/23/17  Yes [provider]  lidocaine-prilocaine (EMLA) cream APPLY 1 APPLICATION AS NEEDED TOPICALLY. 02/24/17  Yes Malachy Mood, MD  Multiple Vitamin (MULTI-VITAMINS) TABS Take by mouth.   Yes [provider]  naproxen (NAPROSYN) 500 MG tablet Take by mouth.   Yes [provider]  Olive Leaf Extract 250 MG CAPS Take by mouth.   Yes [provider]  rizatriptan (MAXALT) 10 MG tablet  04/06/17  Yes [provider]  topiramate (TOPAMAX) 100 MG tablet Take by mouth. 04/06/17 10/03/17 Yes [provider]  topiramate (TOPAMAX) 100 MG tablet  04/06/17  Yes [provider]  Turmeric 500 MG CAPS Take by mouth.   Yes [provider]  norethindrone (AYGESTIN) 5 MG tablet Take 1 tablet (5 mg total) by mouth daily. Patient not taking: Reported on 05/22/2017 04/16/17   Malachy Mood, MD    Physical Exam Vitals:  Vitals:   05/22/17 1551  BP: 130/84  Pulse: (!) 103   Patient's last menstrual period was 04/26/2017 (approximate).  General: NAD HEENT: normocephalic, anicteric Pulmonary: No increased work of breathing Extremities: no edema, erythema, or tenderness Neurologic: Grossly intact Psychiatric: mood appropriate, affect full  Female chaperone present for pelvic  portions of the physical exam  Assessment: 32 y.o. G2P0020 abnormal uterine bleeding  Plan: Problem List Items Addressed This Visit      Other   Endometriosis determined by laparoscopy    Other Visit Diagnoses    Abnormal uterine bleeding    -  Primary     -  Discontinue norethindrone for 5-7 days, then restart.  If no improvement after restarting norethindrone will consider hysteroscopy & laparoscopy to evaluate cavity and endometriosis since last surgery. - Freida Busman remains an option as well for her endometriosis - Return if symptoms  worsen or fail to improve.   Malachy Mood, MD, [facility], [facility] 06/05/2017, 10:42 AM

## 2017-05-27 ENCOUNTER — Other Ambulatory Visit: Payer: Self-pay | Admitting: Obstetrics and Gynecology

## 2017-06-22 ENCOUNTER — Other Ambulatory Visit: Payer: Self-pay | Admitting: Obstetrics and Gynecology

## 2017-06-22 NOTE — Telephone Encounter (Signed)
Please advise 

## 2017-07-24 ENCOUNTER — Other Ambulatory Visit: Payer: Self-pay | Admitting: Obstetrics and Gynecology

## 2017-07-24 ENCOUNTER — Encounter: Payer: Self-pay | Admitting: Obstetrics and Gynecology

## 2017-07-24 MED ORDER — NAPROXEN 500 MG PO TABS: 500.0000 mg | ORAL_TABLET | Freq: Two times a day (BID) | ORAL | 2 refills | Status: DC

## 2017-07-31 ENCOUNTER — Telehealth: Payer: Self-pay | Admitting: Obstetrics and Gynecology

## 2017-07-31 NOTE — Telephone Encounter (Signed)
-----   Message from Malachy Mood, MD sent at 07/30/2017  5:42 AM EDT ----- Regarding: Surgery Surgery Date:   LOS: same day surgery  Surgery Booking Request Patient Full Name: Rachel Garrett MRN: [id number]  DOB: December 06, 1985  Surgeon: Malachy Mood, MD  Requested Surgery Date and Time: Pelvic pain Primary Diagnosis and Code: Endometriosis Secondary Diagnosis and Code: pelvic pain Surgical Procedure: Diagnostic laparoscopy, hysteroscopy L&D Notification:N/A Admission Status: same day surgery Length of Surgery: 1hr Special Case Needs: none H&P:  (date) Phone Interview or Office Pre-Admit: phone interview Interpreter: No Language: English Medical Clearance: No Special Scheduling Instructions:

## 2017-07-31 NOTE — Telephone Encounter (Signed)
Patient is aware of H&P at [facility] on 08/28/17 @ 4:10pm, Pre-admit Testing phone interview to be scheduled (patient will watch for this on MyChart), and OR on 09/04/17. Patient is aware she may receive calls from the [facility] and [facility]. Patient confirmed BCBS, and no secondary insurance. Ext given.

## 2017-08-08 ENCOUNTER — Encounter: Payer: Self-pay | Admitting: Obstetrics and Gynecology

## 2017-08-08 NOTE — Telephone Encounter (Signed)
I am unable to answer this question. Rachel Garrett can address when he returns.

## 2017-08-16 ENCOUNTER — Telehealth: Payer: Self-pay

## 2017-08-16 NOTE — Telephone Encounter (Signed)
FMLA/DISABILITY form for [employer] filled out, signature obtained, and given to [place] for processing. 

## 2017-08-28 ENCOUNTER — Ambulatory Visit (INDEPENDENT_AMBULATORY_CARE_PROVIDER_SITE_OTHER): Payer: BLUE CROSS/BLUE SHIELD | Admitting: Obstetrics and Gynecology

## 2017-08-28 ENCOUNTER — Encounter: Payer: Self-pay | Admitting: Obstetrics and Gynecology

## 2017-08-28 VITALS — BP 132/84 | HR 116 | Ht 68.0 in | Wt 180.0 lb

## 2017-08-28 DIAGNOSIS — N809 Endometriosis, unspecified: Secondary | ICD-10-CM

## 2017-08-28 DIAGNOSIS — Z01818 Encounter for other preprocedural examination: Secondary | ICD-10-CM

## 2017-08-28 DIAGNOSIS — Z9889 Other specified postprocedural states: Secondary | ICD-10-CM

## 2017-08-28 DIAGNOSIS — R102 Pelvic and perineal pain: Secondary | ICD-10-CM

## 2017-08-29 ENCOUNTER — Other Ambulatory Visit: Payer: Self-pay

## 2017-08-29 ENCOUNTER — Encounter
Admission: RE | Admit: 2017-08-29 | Discharge: 2017-08-29 | Disposition: A | Discharge status: Home / Self Care | Payer: BLUE CROSS/BLUE SHIELD | Source: Ambulatory Visit | Attending: Obstetrics and Gynecology | Admitting: Obstetrics and Gynecology

## 2017-08-29 HISTORY — DX: Gastro-esophageal reflux disease without esophagitis: K21.9

## 2017-08-29 HISTORY — DX: Family history of other specified conditions: Z84.89

## 2017-08-29 HISTORY — DX: Anemia, unspecified: D64.9

## 2017-08-29 NOTE — Patient Instructions (Signed)
Your procedure is scheduled on: 09-04-17 TUESDAY Report to Same Day Surgery 2nd floor medical mall [facility] Entrance-take elevator on left to 2nd floor.  Check in with surgery information desk.) To find out your arrival time please call [phone] between 1PM - 3PM on 09-03-17 MONDAY  Remember: Instructions that are not followed completely may result in serious medical risk, up to and including death, or upon the discretion of your surgeon and anesthesiologist your surgery may need to be rescheduled.    _x___ 1. Do not eat food after midnight the night before your procedure. NO GUM OR CANDY AFTER MIDNIGHT.  You may drink clear liquids up to 2 hours before you are scheduled to arrive at the hospital for your procedure.  Do not drink clear liquids within 2 hours of your scheduled arrival to the hospital.  Clear liquids include  --Water or Apple juice without pulp  --Clear carbohydrate beverage such as ClearFast or Gatorade  --Black Coffee or Clear Tea (No milk, no creamers, do not add anything to the coffee or Tea .     __x__ 2. No Alcohol for 24 hours before or after surgery.   __x__3. No Smoking or e-cigarettes for 24 prior to surgery.  Do not use any chewable tobacco products for at least 6 hour prior to surgery   ____  4. Bring all medications with you on the day of surgery if instructed.    __x__ 5. Notify your doctor if there is any change in your medical condition     (cold, fever, infections).    x___6. On the morning of surgery brush your teeth with toothpaste and water.  You may rinse your mouth with mouth wash if you wish.  Do not swallow any toothpaste or mouthwash.   Do not wear jewelry, make-up, hairpins, clips or nail polish.  Do not wear lotions, powders, or perfumes. You may wear deodorant.  Do not shave 48 hours prior to surgery. Men may shave face and neck.  Do not bring valuables to the hospital.    [facility] is not responsible for any belongings or  valuables.               Contacts, dentures or bridgework may not be worn into surgery.  Leave your suitcase in the car. After surgery it may be brought to your room.  For patients admitted to the hospital, discharge time is determined by your treatment team.  _  Patients discharged the day of surgery will not be allowed to drive home.  You will need someone to drive you home and stay with you the night of your procedure.    Please read over the following fact sheets that you were given:   [facility] Preparing for Surgery    _x___ TAKE THE FOLLOWING MEDICATION THE MORNING OF YOUR SURGERY WITH A SMALL SIP OF WATER. These include:  1. CELEXA (CITALOPRAM)  2. TOPAMAX (TOPIRAMATE)  3. ZANTAC (RANITIDINE)  4. TAKE A RANITIDINE THE NIGHT BEFORE YOUR SURGERY  5.  6.  ____Fleets enema or Magnesium Citrate as directed.   _x___ Use CHG Soap or sage wipes as directed on instruction sheet   _X___ Use inhalers on the day of surgery and bring to hospital day of surgery-USE YOUR ALBUTEROL INHALER DAY OF SURGERY AND [place]  ____ Stop Metformin and Janumet 2 days prior to surgery.    ____ Take 1/2 of usual insulin dose the night before surgery and none on  the morning surgery.   ____ Follow recommendations from Cardiologist, Pulmonologist or PCP regarding stopping Aspirin, Coumadin, Plavix ,Eliquis, Effient, or Pradaxa, and Pletal.  X____Stop Anti-inflammatories such as Advil, Aleve, Ibuprofen, Motrin, Naproxen, Naprosyn, Goodies powders or aspirin products NOW-OK to take Tylenol   _x___ Stop supplements until after surgery-STOP OLIVE LEAF EXTRACT NOW-MAY RESUME AFTER SURGERY   ____ Bring C-Pap to the hospital.

## 2017-08-29 NOTE — Progress Notes (Signed)
Obstetrics & Gynecology Surgery H&P    Chief Complaint: Scheduled Surgery   History of Present Illness: Patient is a 32 y.o. G2P0020 presenting for scheduled hysteroscopy, diagnostic laparoscopy, chromopertubation, for the treatment or further evaluation of chronic abdominal pain in setting of prior myomectomy, and known endometriosis.   Prior Treatments prior to proceeding with surgery include: norethindrone for endometriosis  Preoperative Pap: 05/09/16 Results: NIL Preoperative Endometrial biopsy: N/A Findings: pathology Preoperative Ultrasound: 02/16/2017 Findings: Normal cavity contour on SIS, normal ovaries   Review of Systems:10 point review of systems  Past Medical History:  Past Medical History:  Diagnosis Date  . Anxiety   . Asthma   . Concussion   . Depression   . Hypertension   . Migraines     Past Surgical History:  Past Surgical History:  Procedure Laterality Date  . LAPAROTOMY N/A 11/02/2016   Procedure: LAPAROTOMY;  Surgeon: Malachy Mood, MD;  Location: [facility] ORS;  Service: Gynecology;  Laterality: N/A;  . 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 MYOMECTOMY N/A 11/02/2016   Procedure: MYOMECTOMY;  Surgeon: Malachy Mood, MD;  Location: [facility] ORS;  Service: Gynecology;  Laterality: N/A;  . OVARIAN CYST REMOVAL Right 11/02/2016   Procedure: OVARIAN CYSTECTOMY;  Surgeon: Malachy Mood, MD;  Location: [facility] ORS;  Service: Gynecology;  Laterality: Right;  . TONSILLECTOMY    . TONSILLECTOMY      Family History:  Family History  Problem Relation Age of Onset  . Breast cancer Maternal Aunt        [employer]  . Breast cancer Paternal Aunt 27  . Uterine cancer Maternal Aunt   . Breast cancer Paternal 28        Twin half sisters of dad    Social History:  Social History   Socioeconomic History  . Marital status: Single    Spouse name: Not on file  . Number of children: Not on file  . Years of education: Not on file  . Highest education level: Not on file    Occupational History  . Not on file  Social Needs  . Financial resource strain: Not on file  . Food insecurity:    Worry: Not on file    Inability: Not on file  . Transportation needs:    Medical: Not on file    Non-medical: Not on file  Tobacco Use  . Smoking status: Current Every Day Smoker    Packs/day: 0.25    Types: Cigarettes  . Smokeless tobacco: Never Used  Substance and Sexual Activity  . Alcohol use: Yes    Alcohol/week: 1.2 oz    Types: 2 Cans of beer per week    Comment: weekend  . Drug use: No  . Sexual activity: Yes    Birth control/protection: Injection  Lifestyle  . Physical activity:    Days per week: 0 days    Minutes per session: 0 min  . Stress: Very much  Relationships  . Social connections:    Talks on phone: Twice a week    Gets together: Once a week    Attends religious service: Never    Active member of club or organization: No    Attends meetings of clubs or organizations: Never    Relationship status: Living with partner  . Intimate partner violence:    Fear of current or ex partner: No    Emotionally abused: No    Physically abused: No    Forced sexual activity: No  Other Topics Concern  .  Not on file  Social History Narrative  . Not on file    Allergies:  Allergies  Allergen Reactions  . Benadryl [Diphenhydramine Hcl (Sleep)] Swelling  . Latex Itching and Swelling  . Ultram [Tramadol Hcl] Itching    Medications: Prior to Admission medications   Medication Sig Start Date End Date Taking? Authorizing Provider  albuterol (PROVENTIL HFA;VENTOLIN HFA) 108 (90 Base) MCG/ACT inhaler Inhale 1-2 puffs into the lungs every 6 (six) hours as needed for wheezing or shortness of breath.    [provider]  citalopram (CELEXA) 20 MG tablet Take 20 mg by mouth daily.     [provider]  cyclobenzaprine (FLEXERIL) 10 MG tablet Take 10 mg by mouth 3 (three) times daily as needed (for muscle spasms).  02/29/16   [provider]  hydrochlorothiazide (HYDRODIURIL) 12.5 MG tablet Take 12.5 mg by mouth daily.     [provider]  lidocaine-prilocaine (EMLA) cream APPLY 1 APPLICATION AS NEEDED TOPICALLY. 02/24/17   Malachy Mood, MD  naproxen (NAPROSYN) 500 MG tablet Take 1 tablet (500 mg total) by mouth 2 (two) times daily with a meal. As needed for pain Patient taking differently: Take 1,000 mg by mouth daily.  07/24/17   Malachy Mood, MD  norethindrone (AYGESTIN) 5 MG tablet Take 1 tablet (5 mg total) by mouth daily. 04/16/17   Malachy Mood, MD  OLIVE LEAF EXTRACT PO Take 2 capsules by mouth 3 (three) times daily.    [provider]  rizatriptan (MAXALT) 10 MG tablet Take 10 mg by mouth every 2 (two) hours as needed for migraine.  04/06/17   [provider]  topiramate (TOPAMAX) 100 MG tablet Take 50 mg by mouth daily.  04/06/17   [provider]    Physical Exam Vitals: Blood pressure 132/84, pulse (!) 116, height 5\' 8"  (1.727 m), weight 180 lb (81.6 kg), last menstrual period 08/25/2017. General: NAD HEENT: normocephalic, anicteric Pulmonary: CTAB, No increased work of breathing Cardiovascular: RRR, distal pulses 2+ Abdomen: soft, non-tender, non-distended Genitourinary: deferred Extremities: no edema, erythema, or tenderness Neurologic: Grossly intact Psychiatric: mood appropriate, affect full  Imaging No results found.  Assessment: 32 y.o. G2P0020 presenting for scheduled hysteroscopy, diagnostic laparoscopy, and chromopertubation  Problem List Items Addressed This Visit      Other   Endometriosis determined by laparoscopy    Other Visit Diagnoses    Preoperative exam for gynecologic surgery    -  Primary   Pelvic pain       History of myomectomy           Plan: 1) I have had a careful discussion with this patient about all the options available and the risk/benefits of each. I have fully informed this patient that a laparoscopy may  subject her to a variety of discomforts and risks: She understands that most patients have surgery with little difficulty, but problems can happen ranging from minor to fatal. These include nausea, vomiting, pain, bleeding, infection, poor healing, hernia, or formation of adhesions. Unexpected reactions may occur from any drug or anesthetic given. Unintended injury may occur to other pelvic or abdominal structures such as Fallopian tubes, ovaries, bladder, ureter (tube from kidney to bladder), or bowel. Nerves going from the pelvis to the legs may be injured. Any such injury may require immediate or later additional surgery to correct the problem. Excessive blood loss requiring transfusion is very unlikely but possible. Dangerous blood clots may form in the legs or lungs. Physical and  sexual activity will be restricted in varying degrees for an indeterminate period of time but most often 2-4 weeks. She understands that the plan is to do this laparoscopically, however, there is a chance that this will need to be performed via a larger incision. Finally, she understands that it is impossible to list every possible undesirable effect and that the condition for which surgery is done is not always cured or significantly improved, and in rare cases may be even worsen. Ample time was given to answer all questions.   2) Routine postoperative instructions were reviewed with the patient and her family in detail today including the expected length of recovery and likely postoperative course.  The patient concurred with the proposed plan, giving informed written consent for the surgery today.  Patient instructed on the importance of being NPO after midnight prior to her procedure.  If warranted preoperative prophylactic antibiotics and SCDs ordered on call to the OR to meet SCIP guidelines and adhere to recommendation laid forth in [place] Number 104 May 2009  "Antibiotic Prophylaxis for Gynecologic  Procedures".     Malachy Mood, MD, [facility], [facility] 08/29/2017, 1:01 PM

## 2017-08-30 ENCOUNTER — Encounter
Admission: RE | Admit: 2017-08-30 | Discharge: 2017-08-30 | Disposition: A | Discharge status: Home / Self Care | Payer: BLUE CROSS/BLUE SHIELD | Source: Ambulatory Visit | Attending: Obstetrics and Gynecology | Admitting: Obstetrics and Gynecology

## 2017-08-30 ENCOUNTER — Encounter: Payer: Self-pay | Admitting: Obstetrics and Gynecology

## 2017-08-30 DIAGNOSIS — Z01812 Encounter for preprocedural laboratory examination: Secondary | ICD-10-CM | POA: Diagnosis not present

## 2017-08-30 DIAGNOSIS — Z01810 Encounter for preprocedural cardiovascular examination: Secondary | ICD-10-CM | POA: Diagnosis present

## 2017-08-30 DIAGNOSIS — I10 Essential (primary) hypertension: Secondary | ICD-10-CM | POA: Insufficient documentation

## 2017-08-30 LAB — POTASSIUM: POTASSIUM: 3.3 mmol/L — AB (ref 3.5–5.1)

## 2017-08-30 LAB — HEMOGLOBIN: HEMOGLOBIN: 14.1 g/dL (ref 12.0–16.0)

## 2017-08-31 ENCOUNTER — Other Ambulatory Visit: Payer: Self-pay | Admitting: Advanced Practice Midwife

## 2017-08-31 DIAGNOSIS — E876 Hypokalemia: Secondary | ICD-10-CM

## 2017-08-31 MED ORDER — POTASSIUM CHLORIDE CRYS ER 20 MEQ PO TBCR: 20.0000 meq | EXTENDED_RELEASE_TABLET | Freq: Two times a day (BID) | ORAL | 1 refills | Status: DC

## 2017-08-31 NOTE — Progress Notes (Signed)
Rx potassium sent to patient pharmacy.

## 2017-09-04 ENCOUNTER — Encounter: Payer: Self-pay | Admitting: *Deleted

## 2017-09-04 ENCOUNTER — Ambulatory Visit: Payer: BLUE CROSS/BLUE SHIELD | Admitting: Anesthesiology

## 2017-09-04 ENCOUNTER — Ambulatory Visit
Admission: RE | Admit: 2017-09-04 | Discharge: 2017-09-04 | Disposition: A | Discharge status: Home / Self Care | Payer: BLUE CROSS/BLUE SHIELD | Source: Ambulatory Visit | Attending: Obstetrics and Gynecology | Admitting: Obstetrics and Gynecology

## 2017-09-04 ENCOUNTER — Encounter
Admission: RE | Disposition: A | Discharge status: Home / Self Care | Payer: Self-pay | Source: Ambulatory Visit | Attending: Obstetrics and Gynecology

## 2017-09-04 DIAGNOSIS — Z803 Family history of malignant neoplasm of breast: Secondary | ICD-10-CM | POA: Diagnosis not present

## 2017-09-04 DIAGNOSIS — F329 Major depressive disorder, single episode, unspecified: Secondary | ICD-10-CM | POA: Diagnosis not present

## 2017-09-04 DIAGNOSIS — G43909 Migraine, unspecified, not intractable, without status migrainosus: Secondary | ICD-10-CM | POA: Insufficient documentation

## 2017-09-04 DIAGNOSIS — Z888 Allergy status to other drugs, medicaments and biological substances status: Secondary | ICD-10-CM | POA: Diagnosis not present

## 2017-09-04 DIAGNOSIS — N83292 Other ovarian cyst, left side: Secondary | ICD-10-CM | POA: Insufficient documentation

## 2017-09-04 DIAGNOSIS — Z9889 Other specified postprocedural states: Secondary | ICD-10-CM

## 2017-09-04 DIAGNOSIS — Z8049 Family history of malignant neoplasm of other genital organs: Secondary | ICD-10-CM | POA: Diagnosis not present

## 2017-09-04 DIAGNOSIS — R102 Pelvic and perineal pain: Secondary | ICD-10-CM | POA: Diagnosis not present

## 2017-09-04 DIAGNOSIS — F17210 Nicotine dependence, cigarettes, uncomplicated: Secondary | ICD-10-CM | POA: Diagnosis not present

## 2017-09-04 DIAGNOSIS — Z91040 Latex allergy status: Secondary | ICD-10-CM | POA: Insufficient documentation

## 2017-09-04 DIAGNOSIS — Z885 Allergy status to narcotic agent status: Secondary | ICD-10-CM | POA: Diagnosis not present

## 2017-09-04 DIAGNOSIS — N809 Endometriosis, unspecified: Secondary | ICD-10-CM | POA: Diagnosis not present

## 2017-09-04 DIAGNOSIS — F419 Anxiety disorder, unspecified: Secondary | ICD-10-CM | POA: Insufficient documentation

## 2017-09-04 DIAGNOSIS — D252 Subserosal leiomyoma of uterus: Secondary | ICD-10-CM | POA: Insufficient documentation

## 2017-09-04 DIAGNOSIS — G894 Chronic pain syndrome: Secondary | ICD-10-CM | POA: Diagnosis not present

## 2017-09-04 DIAGNOSIS — G8929 Other chronic pain: Secondary | ICD-10-CM | POA: Diagnosis not present

## 2017-09-04 DIAGNOSIS — Z791 Long term (current) use of non-steroidal anti-inflammatories (NSAID): Secondary | ICD-10-CM | POA: Insufficient documentation

## 2017-09-04 DIAGNOSIS — K219 Gastro-esophageal reflux disease without esophagitis: Secondary | ICD-10-CM | POA: Insufficient documentation

## 2017-09-04 DIAGNOSIS — I10 Essential (primary) hypertension: Secondary | ICD-10-CM | POA: Diagnosis not present

## 2017-09-04 DIAGNOSIS — Z793 Long term (current) use of hormonal contraceptives: Secondary | ICD-10-CM | POA: Insufficient documentation

## 2017-09-04 HISTORY — PX: HYSTEROSCOPY: SHX211

## 2017-09-04 HISTORY — PX: LAPAROSCOPY: SHX197

## 2017-09-04 LAB — URINE DRUG SCREEN, QUALITATIVE (ARMC ONLY)
Amphetamines, Ur Screen: NOT DETECTED
Barbiturates, Ur Screen: NOT DETECTED
Benzodiazepine, Ur Scrn: NOT DETECTED
CANNABINOID 50 NG, UR ~~LOC~~: NOT DETECTED
COCAINE METABOLITE, UR ~~LOC~~: NOT DETECTED
MDMA (ECSTASY) UR SCREEN: NOT DETECTED
Methadone Scn, Ur: NOT DETECTED
OPIATE, UR SCREEN: NOT DETECTED
Phencyclidine (PCP) Ur S: NOT DETECTED
Tricyclic, Ur Screen: NOT DETECTED

## 2017-09-04 LAB — POCT I-STAT 4, (NA,K, GLUC, HGB,HCT)
Glucose, Bld: 96 mg/dL (ref 70–99)
HEMATOCRIT: 42 % (ref 36.0–46.0)
Hemoglobin: 14.3 g/dL (ref 12.0–15.0)
Potassium: 3.8 mmol/L (ref 3.5–5.1)
SODIUM: 141 mmol/L (ref 135–145)

## 2017-09-04 LAB — POCT PREGNANCY, URINE: PREG TEST UR: NEGATIVE

## 2017-09-04 SURGERY — LAPAROSCOPY, DIAGNOSTIC
Anesthesia: General | Wound class: Clean Contaminated

## 2017-09-04 MED ORDER — ONDANSETRON HCL 4 MG/2ML IJ SOLN
INTRAMUSCULAR | Status: DC | PRN
  Administered 2017-09-04: 4 mg via INTRAVENOUS

## 2017-09-04 MED ORDER — PHENYLEPHRINE HCL 10 MG/ML IJ SOLN
INTRAMUSCULAR | Status: DC | PRN
  Administered 2017-09-04: 100 ug via INTRAVENOUS
  Administered 2017-09-04: 200 ug via INTRAVENOUS
  Administered 2017-09-04 (×2): 100 ug via INTRAVENOUS

## 2017-09-04 MED ORDER — DEXAMETHASONE SODIUM PHOSPHATE 10 MG/ML IJ SOLN
INTRAMUSCULAR | Status: DC | PRN
  Administered 2017-09-04: 10 mg via INTRAVENOUS

## 2017-09-04 MED ORDER — PHENYLEPHRINE HCL 10 MG/ML IJ SOLN
INTRAMUSCULAR | Status: AC
  Filled 2017-09-04: qty 1

## 2017-09-04 MED ORDER — ROCURONIUM BROMIDE 100 MG/10ML IV SOLN
INTRAVENOUS | Status: DC | PRN
  Administered 2017-09-04: 40 mg via INTRAVENOUS

## 2017-09-04 MED ORDER — FENTANYL CITRATE (PF) 100 MCG/2ML IJ SOLN
INTRAMUSCULAR | Status: AC
  Administered 2017-09-04: 25 ug via INTRAVENOUS
  Filled 2017-09-04: qty 2

## 2017-09-04 MED ORDER — MIDAZOLAM HCL 2 MG/2ML IJ SOLN
INTRAMUSCULAR | Status: AC
  Filled 2017-09-04: qty 2

## 2017-09-04 MED ORDER — FENTANYL CITRATE (PF) 100 MCG/2ML IJ SOLN: 25.0000 ug | INTRAMUSCULAR | Status: DC | PRN

## 2017-09-04 MED ORDER — MIDAZOLAM HCL 2 MG/2ML IJ SOLN
INTRAMUSCULAR | Status: DC | PRN
  Administered 2017-09-04: 2 mg via INTRAVENOUS

## 2017-09-04 MED ORDER — ROCURONIUM BROMIDE 100 MG/10ML IV SOLN
INTRAVENOUS | Status: AC
  Filled 2017-09-04: qty 1

## 2017-09-04 MED ORDER — LACTATED RINGERS IV SOLN
INTRAVENOUS | Status: DC
  Administered 2017-09-04: 08:00:00 via INTRAVENOUS

## 2017-09-04 MED ORDER — KETOROLAC TROMETHAMINE 30 MG/ML IJ SOLN
INTRAMUSCULAR | Status: DC | PRN
  Administered 2017-09-04: 30 mg via INTRAVENOUS

## 2017-09-04 MED ORDER — SUGAMMADEX SODIUM 200 MG/2ML IV SOLN
INTRAVENOUS | Status: DC | PRN
  Administered 2017-09-04: 200 mg via INTRAVENOUS

## 2017-09-04 MED ORDER — PROPOFOL 10 MG/ML IV BOLUS
INTRAVENOUS | Status: AC
  Filled 2017-09-04: qty 20

## 2017-09-04 MED ORDER — METHYLENE BLUE 0.5 % INJ SOLN
INTRAVENOUS | Status: AC
  Filled 2017-09-04: qty 10

## 2017-09-04 MED ORDER — DEXAMETHASONE SODIUM PHOSPHATE 10 MG/ML IJ SOLN
INTRAMUSCULAR | Status: AC
  Filled 2017-09-04: qty 1

## 2017-09-04 MED ORDER — SUGAMMADEX SODIUM 200 MG/2ML IV SOLN
INTRAVENOUS | Status: AC
  Filled 2017-09-04: qty 2

## 2017-09-04 MED ORDER — FENTANYL CITRATE (PF) 100 MCG/2ML IJ SOLN
INTRAMUSCULAR | Status: DC | PRN
  Administered 2017-09-04 (×2): 50 ug via INTRAVENOUS

## 2017-09-04 MED ORDER — FENTANYL CITRATE (PF) 100 MCG/2ML IJ SOLN
INTRAMUSCULAR | Status: AC
Start: 2017-09-04 — End: ?
  Filled 2017-09-04: qty 2

## 2017-09-04 MED ORDER — ONDANSETRON HCL 4 MG/2ML IJ SOLN: 4.0000 mg | Freq: Once | INTRAMUSCULAR | Status: DC | PRN

## 2017-09-04 MED ORDER — BUPIVACAINE HCL (PF) 0.5 % IJ SOLN
INTRAMUSCULAR | Status: AC
  Filled 2017-09-04: qty 30

## 2017-09-04 MED ORDER — KETOROLAC TROMETHAMINE 30 MG/ML IJ SOLN
INTRAMUSCULAR | Status: AC
  Filled 2017-09-04: qty 1

## 2017-09-04 MED ORDER — LIDOCAINE HCL (CARDIAC) PF 100 MG/5ML IV SOSY
PREFILLED_SYRINGE | INTRAVENOUS | Status: DC | PRN
  Administered 2017-09-04: 60 mg via INTRAVENOUS

## 2017-09-04 MED ORDER — ONDANSETRON HCL 4 MG/2ML IJ SOLN
INTRAMUSCULAR | Status: AC
  Filled 2017-09-04: qty 2

## 2017-09-04 MED ORDER — PROPOFOL 10 MG/ML IV BOLUS
INTRAVENOUS | Status: DC | PRN
  Administered 2017-09-04: 160 mg via INTRAVENOUS

## 2017-09-04 MED ORDER — FENTANYL CITRATE (PF) 100 MCG/2ML IJ SOLN
25.0000 ug | INTRAMUSCULAR | Status: AC | PRN
  Administered 2017-09-04 (×6): 25 ug via INTRAVENOUS

## 2017-09-04 MED ORDER — LIDOCAINE HCL (PF) 2 % IJ SOLN
INTRAMUSCULAR | Status: AC
  Filled 2017-09-04: qty 10

## 2017-09-04 MED ORDER — OXYCODONE-ACETAMINOPHEN 5-325 MG PO TABS: 1.0000 | ORAL_TABLET | ORAL | 0 refills | Status: DC | PRN

## 2017-09-04 MED ORDER — BUPIVACAINE HCL 0.5 % IJ SOLN
INTRAMUSCULAR | Status: DC | PRN
  Administered 2017-09-04: 10 mL

## 2017-09-04 SURGICAL SUPPLY — 38 items
BLADE SURG SZ11 CARB STEEL (BLADE) ×2 IMPLANT
CANISTER SUCT 1200ML W/VALVE (MISCELLANEOUS) ×2 IMPLANT
CANISTER SUCT 3000ML PPV (MISCELLANEOUS) ×2 IMPLANT
CATH ROBINSON RED A/P 16FR (CATHETERS) ×2 IMPLANT
CHLORAPREP W/TINT 26ML (MISCELLANEOUS) ×2 IMPLANT
DERMABOND ADVANCED (GAUZE/BANDAGES/DRESSINGS) ×1
DERMABOND ADVANCED .7 DNX12 (GAUZE/BANDAGES/DRESSINGS) ×1 IMPLANT
DRSG TEGADERM 2-3/8X2-3/4 SM (GAUZE/BANDAGES/DRESSINGS) ×2 IMPLANT
GLOVE BIO SURGEON STRL SZ7 (GLOVE) ×2 IMPLANT
GLOVE INDICATOR 7.5 STRL GRN (GLOVE) ×2 IMPLANT
GOWN STRL REUS W/ TWL LRG LVL3 (GOWN DISPOSABLE) ×2 IMPLANT
GOWN STRL REUS W/TWL LRG LVL3 (GOWN DISPOSABLE) ×2
IRRIGATION STRYKERFLOW (MISCELLANEOUS) ×1 IMPLANT
IRRIGATOR STRYKERFLOW (MISCELLANEOUS) ×2
IV LACTATED RINGERS 1000ML (IV SOLUTION) ×2 IMPLANT
KIT PINK PAD W/HEAD ARE REST (MISCELLANEOUS) ×2
KIT PINK PAD W/HEAD ARM REST (MISCELLANEOUS) ×1 IMPLANT
KIT TURNOVER CYSTO (KITS) ×2 IMPLANT
LABEL OR SOLS (LABEL) ×2 IMPLANT
NS IRRIG 500ML POUR BTL (IV SOLUTION) ×2 IMPLANT
PACK DNC HYST (MISCELLANEOUS) ×2 IMPLANT
PACK GYN LAPAROSCOPIC (MISCELLANEOUS) ×2 IMPLANT
PAD OB MATERNITY 4.3X12.25 (PERSONAL CARE ITEMS) ×2 IMPLANT
PAD PREP 24X41 OB/GYN DISP (PERSONAL CARE ITEMS) ×2 IMPLANT
SCISSORS METZENBAUM CVD 33 (INSTRUMENTS) IMPLANT
SEAL ROD LENS SCOPE MYOSURE (ABLATOR) ×2 IMPLANT
SET CYSTO W/LG BORE CLAMP LF (SET/KITS/TRAYS/PACK) ×2 IMPLANT
SHEARS HARMONIC ACE PLUS 36CM (ENDOMECHANICALS) ×2 IMPLANT
SLEEVE ENDOPATH XCEL 5M (ENDOMECHANICALS) IMPLANT
SPONGE GAUZE 2X2 8PLY STRL LF (GAUZE/BANDAGES/DRESSINGS) ×2 IMPLANT
SUT MNCRL AB 4-0 PS2 18 (SUTURE) ×2 IMPLANT
SUT VIC AB 0 CT2 27 (SUTURE) ×2 IMPLANT
SYR 10ML LL (SYRINGE) ×2 IMPLANT
TROCAR ENDO BLADELESS 11MM (ENDOMECHANICALS) IMPLANT
TROCAR XCEL NON-BLD 5MMX100MML (ENDOMECHANICALS) ×2 IMPLANT
TUBING CONNECTING 10 (TUBING) ×2 IMPLANT
TUBING HYSTEROSCOPY DOLPHIN (MISCELLANEOUS) ×2 IMPLANT
TUBING INSUFFLATION (TUBING) ×2 IMPLANT

## 2017-09-04 NOTE — Op Note (Signed)
Preoperative Diagnosis: 1) 32 y.o. with chronic pelvic pain 2) Endometriosis 3) History of prior myomectomy  Postoperative Diagnosis: 1) 32 y.o. with chronic pelvic pain 2) Endometriosis 3) History of prior myomectomy  Operation Performed: Hysteroscopy, diagnostic laparoscopy, chromopertubation  Indication: 32 y.o. G2P0020  with history of prior myomectomy via laparotomy, endometriosis with acute exacerbation of chronic pain despite norethindrone use  Surgeon: Malachy Mood, MD  Anesthesia: General  Preoperative Antibiotics: none  Estimated Blood Loss: 5 mL  Urine Output:: 62mL  Drains or Tubes: none  Implants: none  Specimens Removed: none  Complications: none  Intraoperative Findings: Normal appearing cervix.  Hysteroscopy revealed a normal cavity contour, normal bilateral tubal ostia.  The right aspect of the uterus had one small synechia.  Laparoscopy revealed the uterus to be well healed without adhesive disease to the pelvic sidewall or prior laparotomy incision.  There were two small funal subserosal fibroids approximately 1cm in size.  The right ovary was normal in appearance but slightly adheses to the right pelvic sidewall.  The left ovary contained a simple cyst which was drained.  Both tubes were mobile, free of adhesions, and displayed good spill on chromopertubation.  The cul de sac was free of endometriosis implants.  No active endometriosis type implants were visualized.   Patient Condition: stable  Procedure in Detail:  Patient was taken to the operating room where she was administered general anesthesia.  She was positioned in the dorsal lithotomy position utilizing Allen stirups, prepped and draped in the usual sterile fashion.  Prior to proceeding with procedure a time out was performed.  Attention was turned to the patient's pelvis.  A non-latex foley catheter was used to empty the patient's bladder.  An operative speculum was placed to allow visualization  of the cervix.  The anterior lip of the cervix was grasped with a single tooth tenaculum.  The cervix was sequentially dilated with [facility] dilators.  Hysteroscope was advanced into the uterine cavity noting the above findings.  An acorn canula was placed.  Attention was turned to the patient's abdomen.  The umbilicus was infiltrated with 1% Sensorcaine, before making a stab incision using an 11 blade scalpel.  A 70mm Excel trocar was then used to gain direct entry into the peritoneal cavity utilizing the camera to visualize progress of the trocar during placement.  Once peritoneal entry had been achieved, insufflation was started and pneumoperitoneum established at a pressure of 43mmHg. One additional 47mm excel trocar was placed under direct visualiztion in the left lower quadrant.  General inspection of the abdomen revealed the above noted findings.   Pneumoperitoneum was evacuated.  The trocars were removed.   All trocar sites were then dressed with surgical skin glue.  The acorn canula and single tooth tenaculum were removed.  Sponge needle and instrument counts were correct time two.  The patient tolerated the procedure well and was taken to the recovery room in stable condition.

## 2017-09-04 NOTE — Anesthesia Postprocedure Evaluation (Signed)
Anesthesia Post Note  Patient: Rachel Garrett  Procedure(s) Performed: LAPAROSCOPY DIAGNOSTIC (N/A ) HYSTEROSCOPY (N/A )  Patient location during evaluation: PACU Anesthesia Type: General Level of consciousness: sedated Pain management: pain level controlled Vital Signs Assessment: post-procedure vital signs reviewed and stable Respiratory status: spontaneous breathing and respiratory function stable Cardiovascular status: stable Anesthetic complications: no     Last Vitals:  Vitals:   09/04/17 0746 09/04/17 1040  BP: (!) 120/91 (!) 143/106  Pulse: (!) 104 98  Resp: 18 17  Temp: 36.8 C (!) 36.3 C  SpO2: 100% 100%    Last Pain:  Vitals:   09/04/17 0746  TempSrc: Oral  PainSc: 0-No pain                 Jazariah Teall K

## 2017-09-04 NOTE — Anesthesia Post-op Follow-up Note (Signed)
Anesthesia QCDR form completed.        

## 2017-09-04 NOTE — Discharge Instructions (Signed)

## 2017-09-04 NOTE — Anesthesia Procedure Notes (Signed)
Procedure Name: Intubation Date/Time: 09/04/2017 9:37 AM Performed by: Eben Burow, CRNA Pre-anesthesia Checklist: Patient identified, Emergency Drugs available, Suction available, Patient being monitored and Timeout performed Patient Re-evaluated:Patient Re-evaluated prior to induction Oxygen Delivery Method: Circle system utilized Preoxygenation: Pre-oxygenation with 100% oxygen Induction Type: IV induction Laryngoscope Size: McGraph and 3 Grade View: Grade I Tube type: Oral Tube size: 7.0 mm Number of attempts: 3 (Failed attempt with MAC 3 by [place] ; 1 failed attempt with MAC 3 by Dr. Ronelle Nigh; 1 successful attempt with [place] 3 by Dr. Ronelle Nigh) Airway Equipment and Method: Stylet and Video-laryngoscopy Placement Confirmation: positive ETCO2 and breath sounds checked- equal and bilateral Secured at: 24 cm Tube secured with: Tape Dental Injury: Teeth and Oropharynx as per pre-operative assessment  Difficulty Due To: Difficulty was anticipated, Difficult Airway- due to reduced neck mobility and Difficult Airway- due to anterior larynx

## 2017-09-04 NOTE — Anesthesia Preprocedure Evaluation (Addendum)
Anesthesia Evaluation  Patient identified by MRN, date of birth, ID band Patient awake    Reviewed: Allergy & Precautions, NPO status , Patient's Chart, lab work & pertinent test results  History of Anesthesia Complications (+) Family history of anesthesia reaction and history of anesthetic complications (prolonged emergence with father's cousin)  Airway Mallampati: II       Dental   Pulmonary neg shortness of breath, asthma , neg COPD, Current Smoker,           Cardiovascular hypertension, Pt. on medications (-) Past MI and (-) CHF (-) dysrhythmias (-) Valvular Problems/Murmurs     Neuro/Psych Anxiety Depression    GI/Hepatic Neg liver ROS, GERD  Medicated,(+)     substance abuse  marijuana use,   Endo/Other  neg diabetes  Renal/GU negative Renal ROS     Musculoskeletal   Abdominal   Peds  Hematology   Anesthesia Other Findings   Reproductive/Obstetrics                            Anesthesia Physical Anesthesia Plan  ASA: III  Anesthesia Plan: General   Post-op Pain Management:    Induction: Intravenous  PONV Risk Score and Plan: 2 and Dexamethasone and Ondansetron  Airway Management Planned: Oral ETT  Additional Equipment:   Intra-op Plan:   Post-operative Plan:   Informed Consent: I have reviewed the patients History and Physical, chart, labs and discussed the procedure including the risks, benefits and alternatives for the proposed anesthesia with the patient or authorized representative who has indicated his/her understanding and acceptance.     Plan Discussed with:   Anesthesia Plan Comments:         Anesthesia Quick Evaluation

## 2017-09-04 NOTE — Transfer of Care (Signed)
Immediate Anesthesia Transfer of Care Note  Patient: Rachel Garrett  Procedure(s) Performed: LAPAROSCOPY DIAGNOSTIC (N/A ) HYSTEROSCOPY (N/A )  Patient Location: PACU  Anesthesia Type:General  Level of Consciousness: awake, alert , oriented and patient cooperative  Airway & Oxygen Therapy: Patient Spontanous Breathing and Patient connected to face mask oxygen  Post-op Assessment: Report given to RN and Post -op Vital signs reviewed and stable  Post vital signs: Reviewed and stable  Last Vitals:  Vitals Value Taken Time  BP 143/106 09/04/2017 10:37 AM  Temp    Pulse 98 09/04/2017 10:39 AM  Resp 17 09/04/2017 10:39 AM  SpO2 100 % 09/04/2017 10:39 AM  Vitals shown include unvalidated device data.  Last Pain:  Vitals:   09/04/17 0746  TempSrc: Oral  PainSc: 0-No pain         Complications: No apparent anesthesia complications

## 2017-09-04 NOTE — H&P (Signed)
Date of Initial H&P: 08/28/17  History reviewed, patient examined, no change in status, stable for surgery.

## 2017-09-05 ENCOUNTER — Encounter: Payer: Self-pay | Admitting: Obstetrics and Gynecology

## 2017-09-05 NOTE — Telephone Encounter (Signed)
FMLA/DISABILITY form for [employer] amended today and faxed then given to [place].

## 2017-09-06 ENCOUNTER — Telehealth: Payer: Self-pay

## 2017-09-06 NOTE — Telephone Encounter (Signed)
Pt called triage she states that she just had surgery and the pain meds are making her itch. She is in pain but cant take the pain meds because, she's itching everywhere, pt would like something else sent in . Please advise

## 2017-09-06 NOTE — Telephone Encounter (Signed)
[facility] is aware and will get in touch with the patient

## 2017-09-11 ENCOUNTER — Ambulatory Visit (INDEPENDENT_AMBULATORY_CARE_PROVIDER_SITE_OTHER): Payer: BLUE CROSS/BLUE SHIELD | Admitting: Obstetrics and Gynecology

## 2017-09-11 ENCOUNTER — Encounter: Payer: Self-pay | Admitting: Obstetrics and Gynecology

## 2017-09-11 VITALS — BP 110/62 | HR 94 | Ht 68.0 in | Wt 177.0 lb

## 2017-09-11 DIAGNOSIS — Z4889 Encounter for other specified surgical aftercare: Secondary | ICD-10-CM

## 2017-09-11 MED ORDER — ELAGOLIX SODIUM 150 MG PO TABS: 1.0000 | ORAL_TABLET | Freq: Every day | ORAL | 5 refills | Status: DC

## 2017-09-11 NOTE — Progress Notes (Signed)
Postoperative Follow-up Patient presents post op from hysteroscopy, diagnostic laparoscopy 1weeks ago for pelvic pain.  Subjective: Patient reports some improvement in her preop symptoms. Eating a regular diet without difficulty. Pain is controlled with current analgesics. Medications being used: percocet and ibuprofen.  Activity: normal activities of daily living.  Objective: Blood pressure 110/62, pulse 94, height 5\' 8"  (1.727 m), weight 177 lb (80.3 kg), last menstrual period 08/25/2017.  Gen: NAD  HEENT: normocephalic anicteric Pulmonary: no increased work of breathing Abdomen: soft, non-tender, non-distended, trocar sites D/C/I  Admission on 09/04/2017, Discharged on 09/04/2017  Component Date Value Ref Range Status  . Tricyclic, Ur Screen 81/02/7508 NONE DETECTED  NONE DETECTED Final  . Amphetamines, Ur Screen 09/04/2017 NONE DETECTED  NONE DETECTED Final  . MDMA (Ecstasy)Ur Screen 09/04/2017 NONE DETECTED  NONE DETECTED Final  . Cocaine Metabolite,Ur [place] 09/04/2017 NONE DETECTED  NONE DETECTED Final  . Opiate, Ur Screen 09/04/2017 NONE DETECTED  NONE DETECTED Final  . Phencyclidine (PCP) Ur S 09/04/2017 NONE DETECTED  NONE DETECTED Final  . Cannabinoid 50 Ng, Ur [place] 09/04/2017 NONE DETECTED  NONE DETECTED Final  . Barbiturates, Ur Screen 09/04/2017 NONE DETECTED  NONE DETECTED Final  . Benzodiazepine, Ur Scrn 09/04/2017 NONE DETECTED  NONE DETECTED Final  . Methadone Scn, Ur 09/04/2017 NONE DETECTED  NONE DETECTED Final   Comment: (NOTE) Tricyclics + metabolites, urine    Cutoff 1000 ng/mL Amphetamines + metabolites, urine  Cutoff 1000 ng/mL MDMA (Ecstasy), urine              Cutoff 500 ng/mL Cocaine Metabolite, urine          Cutoff 300 ng/mL Opiate + metabolites, urine        Cutoff 300 ng/mL Phencyclidine (PCP), urine         Cutoff 25 ng/mL Cannabinoid, urine                 Cutoff 50 ng/mL Barbiturates + metabolites, urine  Cutoff 200 ng/mL Benzodiazepine, urine               Cutoff 200 ng/mL Methadone, urine                   Cutoff 300 ng/mL The urine drug screen provides only a preliminary, unconfirmed analytical test result and should not be used for non-medical purposes. Clinical consideration and professional judgment should be applied to any positive drug screen result due to possible interfering substances. A more specific alternate chemical method must be used in order to obtain a confirmed analytical result. Gas chromatography / mass spectrometry (GC/MS) is the preferred confirmat                          ory method. Performed at [facility], [address], [place], [place] [address]   . Preg Test, Ur 09/04/2017 NEGATIVE  NEGATIVE Final   Comment:        THE SENSITIVITY OF THIS METHODOLOGY IS >24 mIU/mL   . Sodium 09/04/2017 141  135 - 145 mmol/L Final  . Potassium 09/04/2017 3.8  3.5 - 5.1 mmol/L Final  . Glucose, Bld 09/04/2017 96  70 - 99 mg/dL Final  . HCT 09/04/2017 42.0  36.0 - 46.0 % Final  . Hemoglobin 09/04/2017 14.3  12.0 - 15.0 g/dL Final    Assessment: 32 y.o. s/p hysteroscopy, diagnostic laparoscopy stable  Plan: Patient has done well after surgery with no apparent  complications.  I have discussed the post-operative course to date, and the expected progress moving forward.  The patient understands what complications to be concerned about.  I will see the patient in routine follow up, or sooner if needed.    Activity plan: May return to work in 4 days  - discontinue norethindrone start trial of orilissa  - Return in about 5 weeks (around 10/16/2017) for 6 week postop.    Malachy Mood, MD, [facility], [facility] 09/12/2017, 8:16 AM

## 2017-09-24 ENCOUNTER — Telehealth: Payer: Self-pay

## 2017-09-24 NOTE — Telephone Encounter (Signed)
FMLA/DISABILITY form for [employer] amended for the third time.

## 2017-10-02 NOTE — Telephone Encounter (Signed)
A new clearer set of FMLA/Disability forms filled out, signature obtained and given to [place] for processing. (sixth time they were sent back to [place])

## 2017-10-06 ENCOUNTER — Other Ambulatory Visit: Payer: Self-pay | Admitting: Obstetrics and Gynecology

## 2017-10-06 MED ORDER — VALACYCLOVIR HCL 1 G PO TABS: 1000.0000 mg | ORAL_TABLET | Freq: Every day | ORAL | 11 refills | Status: DC

## 2017-10-18 ENCOUNTER — Ambulatory Visit: Payer: BLUE CROSS/BLUE SHIELD | Admitting: Obstetrics and Gynecology

## 2017-11-20 ENCOUNTER — Ambulatory Visit (INDEPENDENT_AMBULATORY_CARE_PROVIDER_SITE_OTHER): Payer: BLUE CROSS/BLUE SHIELD | Admitting: Obstetrics and Gynecology

## 2017-11-20 ENCOUNTER — Encounter: Payer: Self-pay | Admitting: Obstetrics and Gynecology

## 2017-11-20 VITALS — BP 118/90 | HR 104 | Ht 68.0 in | Wt 178.0 lb

## 2017-11-20 DIAGNOSIS — N938 Other specified abnormal uterine and vaginal bleeding: Secondary | ICD-10-CM

## 2017-11-20 DIAGNOSIS — N809 Endometriosis, unspecified: Secondary | ICD-10-CM | POA: Diagnosis not present

## 2017-11-20 DIAGNOSIS — N939 Abnormal uterine and vaginal bleeding, unspecified: Secondary | ICD-10-CM

## 2017-11-20 MED ORDER — NORETHINDRONE ACETATE 5 MG PO TABS: 5.0000 mg | ORAL_TABLET | Freq: Every day | ORAL | 11 refills | Status: DC

## 2017-11-22 NOTE — Progress Notes (Signed)
Gynecology Abnormal Uterine Bleeding Initial Evaluation   Chief Complaint:  Chief Complaint  Patient presents with  . Menorrhagia    History of Present Illness:    Paitient is a 32 y.o. G2P0020 who LMP was Patient's last menstrual period was 10/28/2017 (exact date)., presents today for a problem visit.  She complains of menorrhagia that  began two weeks ago and its severity is described as severe.  The patient menstrual complaints are acute. Her periods were mostly absent on norethindrone, but had two week period that did not resolve until she discontinued her Freida Busman which she is taking for endometriosis.   She currently uses condomsfor contraception.  Last Pap results esults were obtained 05/09/16  no abnormalities   Previous evaluation: [place] and diagnostic laparoscopy/hysteroscopy Prior Diagnosis: endometriosis. Previous Treatment: NSAID's, norethindrone, and orilissa.  LMP: Patient's last menstrual period was 10/28/2017 (exact date).   Review of Systems: Review of Systems  Constitutional: Negative.   Gastrointestinal: Positive for abdominal pain. Negative for blood in stool, constipation, diarrhea, heartburn, melena, nausea and vomiting.  Genitourinary: Negative.     Past Medical History:  Past Medical History:  Diagnosis Date  . Anemia   . Anxiety   . Asthma    well controlled  . Concussion   . Depression   . Family history of adverse reaction to anesthesia    sister n/v, paternalcousin had trouble  waking  up  . GERD (gastroesophageal reflux disease)    occ  . Hypertension   . Migraines    migraines    Past Surgical History:  Past Surgical History:  Procedure Laterality Date  . HYSTEROSCOPY N/A 09/04/2017   Procedure: HYSTEROSCOPY;  Surgeon: Malachy Mood, MD;  Location: [facility];  Service: Gynecology;  Laterality: N/A;  . LAPAROSCOPY N/A 09/04/2017   Procedure: LAPAROSCOPY DIAGNOSTIC;  Surgeon: Malachy Mood, MD;  Location: [facility];  Service:  Gynecology;  Laterality: N/A;  . LAPAROTOMY N/A 11/02/2016   Procedure: LAPAROTOMY;  Surgeon: Malachy Mood, MD;  Location: [facility];  Service: Gynecology;  Laterality: N/A;  . 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 MYOMECTOMY N/A 11/02/2016   Procedure: MYOMECTOMY;  Surgeon: Malachy Mood, MD;  Location: [facility];  Service: Gynecology;  Laterality: N/A;  . OVARIAN CYST REMOVAL Right 11/02/2016   Procedure: OVARIAN CYSTECTOMY;  Surgeon: Malachy Mood, MD;  Location: [facility];  Service: Gynecology;  Laterality: Right;  . TONSILLECTOMY    . TONSILLECTOMY      Obstetric History: G2P0020  Family History:  Family History  Problem Relation Age of Onset  . Breast cancer Maternal Aunt        [employer]  . Breast cancer Paternal Aunt 46  . Uterine cancer Maternal Aunt   . Breast cancer Paternal 76        Twin half sisters of dad    Social History:  Social History   Socioeconomic History  . Marital status: Single    Spouse name: Not on file  . Number of children: Not on file  . Years of education: Not on file  . Highest education level: Not on file  Occupational History  . Not on file  Social Needs  . Financial resource strain: Not on file  . Food insecurity:    Worry: Not on file    Inability: Not on file  . Transportation needs:    Medical: Not on file    Non-medical: Not on file  Tobacco Use  . Smoking status: Current Every  Day Smoker    Packs/day: 0.25    Years: 10.00    Pack years: 2.50    Types: Cigarettes  . Smokeless tobacco: Never Used  Substance and Sexual Activity  . Alcohol use: Yes    Alcohol/week: 2.0 standard drinks    Types: 2 Cans of beer per week    Comment: weekend  . Drug use: Not Currently    Comment: PT DENIES BUT HAS A + UDS IN 2017 FOR MARIJUANA  . Sexual activity: Yes    Birth control/protection: Injection  Lifestyle  . Physical activity:    Days per week: 0 days    Minutes per session: 0 min  . Stress: Very much  Relationships  . Social  connections:    Talks on phone: Twice a week    Gets together: Once a week    Attends religious service: Never    Active member of club or organization: No    Attends meetings of clubs or organizations: Never    Relationship status: Living with partner  . Intimate partner violence:    Fear of current or ex partner: No    Emotionally abused: No    Physically abused: No    Forced sexual activity: No  Other Topics Concern  . Not on file  Social History Narrative  . Not on file    Allergies:  Allergies  Allergen Reactions  . Benadryl [Diphenhydramine Hcl (Sleep)] Swelling  . Chocolate     migraines  . Estrogens     Increases chances of having stroke  . Latex Itching and Swelling  . Ultram [Tramadol Hcl] Itching    Medications: Prior to Admission medications   Medication Sig Start Date End Date Taking? Authorizing Provider  acetaminophen-codeine (TYLENOL #3) 300-30 MG tablet  07/17/17  Yes [provider]  albuterol (PROVENTIL HFA;VENTOLIN HFA) 108 (90 Base) MCG/ACT inhaler Inhale 1-2 puffs into the lungs every 6 (six) hours as needed for wheezing or shortness of breath.   Yes [provider]  citalopram (CELEXA) 20 MG tablet Take 20 mg by mouth every morning.    Yes [provider]  cyclobenzaprine (FLEXERIL) 10 MG tablet Take 10 mg by mouth 3 (three) times daily as needed (for muscle spasms).  02/29/16  Yes [provider]  hydrochlorothiazide (HYDRODIURIL) 12.5 MG tablet Take 12.5 mg by mouth daily.    Yes [provider]  lidocaine-prilocaine (EMLA) cream APPLY 1 APPLICATION AS NEEDED TOPICALLY. Patient taking differently: Apply 1 application topically as needed (for nerve pain).  02/24/17  Yes Malachy Mood, MD  naproxen (NAPROSYN) 500 MG tablet Take 1 tablet (500 mg total) by mouth 2 (two) times daily with a meal. As needed for pain Patient taking differently: Take 1,000 mg by mouth daily.  07/24/17  Yes Malachy Mood, MD    norethindrone (AYGESTIN) 5 MG tablet Take 1 tablet (5 mg total) by mouth daily. 11/20/17  Yes Malachy Mood, MD  OLIVE LEAF EXTRACT PO Take 2 capsules by mouth 3 (three) times daily.   Yes [provider]  oxyCODONE-acetaminophen (PERCOCET/ROXICET) 5-325 MG tablet Take 1 tablet by mouth every 4 (four) hours as needed. 09/04/17  Yes Malachy Mood, MD  potassium chloride SA (K-DUR,KLOR-CON) 20 MEQ tablet Take 1 tablet (20 mEq total) by mouth 2 (two) times daily. 08/31/17  Yes Rod Can, CNM  ranitidine (ZANTAC) 150 MG tablet Take 150 mg by mouth as needed for heartburn.   Yes [provider]  rizatriptan (MAXALT) 10 MG  tablet Take 10 mg by mouth every 2 (two) hours as needed for migraine.  04/06/17  Yes [provider]  topiramate (TOPAMAX) 100 MG tablet Take 50 mg by mouth every morning.  04/06/17  Yes [provider]  valACYclovir (VALTREX) 1000 MG tablet Take 1 tablet (1,000 mg total) by mouth daily. 10/06/17  Yes Malachy Mood, MD    Physical Exam Blood pressure 118/90, pulse (!) 104, height 5\' 8"  (1.727 m), weight 178 lb (80.7 kg), last menstrual period 10/28/2017.  Patient's last menstrual period was 10/28/2017 (exact date).  General: NAD HEENT: normocephalic, anicteric Pulmonary: No increased work of breathing Extremities: no edema, erythema, or tenderness Neurologic: Grossly intact Psychiatric: mood appropriate, affect full  Female chaperone present for pelvic portions of the physical exam  Assessment: 32 y.o. [id number] with abnormal uterine bleeding  Plan: Problem List Items Addressed This Visit      Other   Endometriosis determined by laparoscopy - Primary    Other Visit Diagnoses    Abnormal uterine bleeding (AUB)          1) Discussed management options for abnormal uterine bleeding including expectant, NSAIDs, tranexamic acid (Lysteda), oral progesterone (Provera, norethindrone, megace), Depo Provera, and Levonorgestrel  containing IUD.  Discussed risks and benefits of each method.   Bleeding resolved with cessation of Orilissa.  She has done well from a pain standpoint, but reports better cycle control on norethindrone.  We discussed A) switching back to norethindrone B) continuing orilissa C) Combination norethindrone and orilissa D) Norethindrone +/- gabapentin and or letrozole E) Mirena IUD + orilissa  At present the patient feels most comfortable with restarting norethindrone  2) Return in about 2 months (around 01/20/2018) for medication follow up.   Malachy Mood, MD, [facility], [place]

## 2017-12-18 ENCOUNTER — Other Ambulatory Visit: Payer: Self-pay | Admitting: Obstetrics and Gynecology

## 2017-12-18 MED ORDER — LETROZOLE 2.5 MG PO TABS: 2.5000 mg | ORAL_TABLET | Freq: Every day | ORAL | 6 refills | Status: DC

## 2017-12-18 NOTE — Progress Notes (Signed)
l °

## 2017-12-21 ENCOUNTER — Ambulatory Visit: Payer: BLUE CROSS/BLUE SHIELD | Admitting: Obstetrics and Gynecology

## 2018-01-25 ENCOUNTER — Telehealth: Payer: Self-pay

## 2018-01-25 NOTE — Telephone Encounter (Signed)
I have not filled out anything for her. To my knowledge nor have I written her name in my book

## 2018-01-25 NOTE — Telephone Encounter (Signed)
Pt states [employer] hasn't received paperwork.  Please fax this to them and let me know.  [phone]

## 2018-02-28 ENCOUNTER — Telehealth: Payer: Self-pay

## 2018-02-28 NOTE — Telephone Encounter (Signed)
[facility] has started a prior authorization renewal through CoverMyMeds,for continued therapy for patient. Is patient still on Orilissa? I see end date for original Rx is 11/2017. Let me know if you want me to work on the prior authorization. Pt may also need a follow up appointment, has not been seen in few months.   Thank you

## 2018-02-28 NOTE — Telephone Encounter (Signed)
PA will be started tomorrow when I get back to [place] office

## 2018-02-28 NOTE — Telephone Encounter (Signed)
Yes should still be on orilissa

## 2018-03-01 NOTE — Telephone Encounter (Signed)
PA started through Cover My Meds

## 2018-03-09 ENCOUNTER — Other Ambulatory Visit: Payer: Self-pay | Admitting: Obstetrics and Gynecology

## 2018-03-14 NOTE — Telephone Encounter (Signed)
LVm for patient to call me back

## 2018-03-14 NOTE — Telephone Encounter (Signed)
Megan from [employer] calling to f/u on prior auth for orilissa 150mg  tab.  They received an error msg stating the pt is no longer covered by [employer].  Please verify ins and let them know what ins it is so the correct form can be used to start prior auth.  Key: [id number]   [phone]

## 2018-03-15 NOTE — Telephone Encounter (Signed)
Left another voicemail for patient to call me back regarding insurance.

## 2018-03-15 NOTE — Telephone Encounter (Signed)
Pt states she has new insurance. She has also picked the medication up from pharmacy under her new insurance. Pt aware to bring a copy of new insurance to office at her convenience for [place] to scan in her chart.

## 2018-03-28 ENCOUNTER — Telehealth: Payer: Self-pay

## 2018-03-28 NOTE — Telephone Encounter (Signed)
Per pt request, Lauren from Leave Management with [employer] called to say they are sending new medical certification as they need it to extend pt's leave.

## 2018-03-28 NOTE — Telephone Encounter (Signed)
I was told my Brayton Layman that this was taken care of already?

## 2018-03-29 NOTE — Telephone Encounter (Signed)
Paperwork from [place] received.

## 2018-04-01 NOTE — Telephone Encounter (Signed)
She should not be on any leave based on [place]

## 2018-04-02 NOTE — Telephone Encounter (Signed)
FMLA/DISABILITY form for [employer] was NOT filled out.  Only wrote "Per Dr Georgianne Fick, pt should not be on any leave based on [place].  Pt last ween on 11/20/17 and was no show for appt on 12/21/17.  Was to return to work without restrictions on 09/15/17."

## 2018-07-18 ENCOUNTER — Other Ambulatory Visit: Payer: Self-pay | Admitting: Obstetrics and Gynecology

## 2018-07-18 NOTE — Telephone Encounter (Signed)
advise

## 2018-09-02 ENCOUNTER — Ambulatory Visit: Payer: BLUE CROSS/BLUE SHIELD | Admitting: Obstetrics and Gynecology

## 2018-09-02 ENCOUNTER — Other Ambulatory Visit: Payer: Self-pay

## 2018-09-16 DIAGNOSIS — G5600 Carpal tunnel syndrome, unspecified upper limb: Secondary | ICD-10-CM | POA: Insufficient documentation

## 2018-10-15 ENCOUNTER — Other Ambulatory Visit: Payer: Self-pay | Admitting: Obstetrics and Gynecology

## 2018-12-11 ENCOUNTER — Telehealth: Payer: BLUE CROSS/BLUE SHIELD | Admitting: Family

## 2018-12-11 DIAGNOSIS — R102 Pelvic and perineal pain: Secondary | ICD-10-CM

## 2018-12-11 NOTE — Progress Notes (Signed)
Based on what you shared with me, I feel your condition warrants further evaluation and I recommend that you be seen for a face to face office visit.   Given your symptoms of vagina pain, you need to be seen face to face for further testing and investigation to figure out what is causing this pain.   NOTE: If you entered your credit card information for this eVisit, you will not be charged. You may see a "hold" on your card for the $35 but that hold will drop off and you will not have a charge processed.  If you are having a true medical emergency please call 911.     For an urgent face to face visit, [facility] has four urgent care centers for your convenience:    NEW:  [facility] [place] [address] [place], [place] [address] .  Monday - Friday 10 am - 6 pm    . [facility]    [phone]                  Get Driving Directions  [id number] [address] [place], [place] [address] . 10 am to 8 pm Monday-Friday . 12 pm to 8 pm Saturday-Sunday   . [facility] at [place]                  Get Driving Directions  [phone] [address], [place], [place] [address] . 8 am to 8 pm Monday-Friday . 9 am to 6 pm Saturday . 11 am to 6 pm Sunday     . [facility] at [place]                  Get Driving Directions   [address], [place] [address] . 8 am to 8 pm Monday-Friday . 8 am to 4 pm Saturday-Sunday    . [facility] at [place]                    Get Driving Directions  [id number]  [address], [place], [place] [address]  . Monday-Friday, 12 PM to 6 PM    Your e-visit answers were reviewed by a board certified advanced clinical practitioner to complete your personal care plan.  Thank you for using e-Visits.

## 2018-12-24 ENCOUNTER — Ambulatory Visit (INDEPENDENT_AMBULATORY_CARE_PROVIDER_SITE_OTHER): Payer: Managed Care, Other (non HMO) | Admitting: Obstetrics and Gynecology

## 2018-12-24 ENCOUNTER — Encounter: Payer: Self-pay | Admitting: Obstetrics and Gynecology

## 2018-12-24 ENCOUNTER — Other Ambulatory Visit (HOSPITAL_COMMUNITY)
Admission: RE | Admit: 2018-12-24 | Discharge: 2018-12-24 | Disposition: A | Discharge status: Home / Self Care | Payer: Managed Care, Other (non HMO) | Source: Ambulatory Visit | Attending: Obstetrics and Gynecology | Admitting: Obstetrics and Gynecology

## 2018-12-24 ENCOUNTER — Other Ambulatory Visit: Payer: Self-pay

## 2018-12-24 VITALS — BP 114/82 | HR 111 | Ht 68.0 in | Wt 178.0 lb

## 2018-12-24 DIAGNOSIS — Z1239 Encounter for other screening for malignant neoplasm of breast: Secondary | ICD-10-CM

## 2018-12-24 DIAGNOSIS — Z01419 Encounter for gynecological examination (general) (routine) without abnormal findings: Secondary | ICD-10-CM

## 2018-12-24 DIAGNOSIS — R102 Pelvic and perineal pain: Secondary | ICD-10-CM

## 2018-12-24 DIAGNOSIS — N809 Endometriosis, unspecified: Secondary | ICD-10-CM

## 2018-12-24 DIAGNOSIS — Z113 Encounter for screening for infections with a predominantly sexual mode of transmission: Secondary | ICD-10-CM

## 2018-12-24 DIAGNOSIS — Z124 Encounter for screening for malignant neoplasm of cervix: Secondary | ICD-10-CM | POA: Diagnosis not present

## 2018-12-24 NOTE — Progress Notes (Signed)
Gynecology Annual Exam   PCP: [facility]  Chief Complaint:  Chief Complaint  Patient presents with  . Gynecologic Exam    pain from Endometriosis    History of Present Illness: Patient is a 33 y.o. G2P0020 presents for annual exam. The patient has no complaints today.   LMP: Patient's last menstrual period was 11/30/2018. Average Interval: regular monthly Duration of flow: 6 days Heavy Menses: yes Clots: no Intermenstrual Bleeding: no Postcoital Bleeding: no Dysmenorrhea: no  The patient is sexually active. She currently uses none for contraception. She denies dyspareunia.  The patient does perform self breast exams.  There is no notable family history of breast or ovarian cancer in her family.  The patient does have a history of endometriosis and uterine fibroids s/p abdominal myomectomy.  She has been attempted control of symptoms with OCP, norethindrone, orilissa, and norethindrone + letrzole 2.5mg  daily.  Post myomectomy SIS normal, and follow up laparoscopy and hysteroscopy normal 09/04/2017.  The patient wears seatbelts: yes.   The patient has regular exercise: not asked.    The patient denies current symptoms of depression.    Review of Systems: Review of Systems  Constitutional: Negative for chills and fever.  HENT: Negative for congestion.   Respiratory: Negative for cough and shortness of breath.   Cardiovascular: Negative for chest pain and palpitations.  Gastrointestinal: Positive for abdominal pain. Negative for constipation, diarrhea, heartburn, nausea and vomiting.  Genitourinary: Negative for dysuria, frequency and urgency.  Skin: Negative for itching and rash.  Neurological: Negative for dizziness and headaches.  Endo/Heme/Allergies: Negative for polydipsia.  Psychiatric/Behavioral: Negative for depression.    Past Medical History:  Past Medical History:  Diagnosis Date  . Anemia   . Anxiety   . Asthma    well controlled   . Concussion   . Depression   . Family history of adverse reaction to anesthesia    sister n/v, paternalcousin had trouble  waking  up  . GERD (gastroesophageal reflux disease)    occ  . Hypertension   . Migraines    migraines    Past Surgical History:  Past Surgical History:  Procedure Laterality Date  . HYSTEROSCOPY N/A 09/04/2017   Procedure: HYSTEROSCOPY;  Surgeon: Malachy Mood, MD;  Location: [facility];  Service: Gynecology;  Laterality: N/A;  . LAPAROSCOPY N/A 09/04/2017   Procedure: LAPAROSCOPY DIAGNOSTIC;  Surgeon: Malachy Mood, MD;  Location: [facility];  Service: Gynecology;  Laterality: N/A;  . LAPAROTOMY N/A 11/02/2016   Procedure: LAPAROTOMY;  Surgeon: Malachy Mood, MD;  Location: [facility];  Service: Gynecology;  Laterality: N/A;  . 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 MYOMECTOMY N/A 11/02/2016   Procedure: MYOMECTOMY;  Surgeon: Malachy Mood, MD;  Location: [facility];  Service: Gynecology;  Laterality: N/A;  . OVARIAN CYST REMOVAL Right 11/02/2016   Procedure: OVARIAN CYSTECTOMY;  Surgeon: Malachy Mood, MD;  Location: [facility];  Service: Gynecology;  Laterality: Right;  . TONSILLECTOMY    . TONSILLECTOMY      Gynecologic History:  Patient's last menstrual period was 11/30/2018. Contraception: none Last Pap: 05/09/2016 Results were: no abnormalities   Obstetric History: G2P0020  Family History:  Family History  Problem Relation Age of Onset  . Breast cancer Maternal Aunt        [employer]  . Breast cancer Paternal Aunt 36  . Cervical cancer Maternal Aunt   . Breast cancer Paternal Aunt        Twin half sisters  of dad    Social History:  Social History   Socioeconomic History  . Marital status: Single    Spouse name: Not on file  . Number of children: Not on file  . Years of education: Not on file  . Highest education level: Not on file  Occupational History  . Not on file  Social Needs  . Financial resource strain: Not on file  . Food  insecurity    Worry: Not on file    Inability: Not on file  . Transportation needs    Medical: Not on file    Non-medical: Not on file  Tobacco Use  . Smoking status: Current Every Day Smoker    Packs/day: 0.25    Years: 10.00    Pack years: 2.50    Types: Cigarettes  . Smokeless tobacco: Never Used  Substance and Sexual Activity  . Alcohol use: Yes    Alcohol/week: 2.0 standard drinks    Types: 2 Cans of beer per week    Comment: weekend  . Drug use: Not Currently    Comment: PT DENIES BUT HAS A + UDS IN 2017 FOR MARIJUANA  . Sexual activity: Yes    Birth control/protection: None  Lifestyle  . Physical activity    Days per week: 0 days    Minutes per session: 0 min  . Stress: Very much  Relationships  . Social Herbalist on phone: Twice a week    Gets together: Once a week    Attends religious service: Never    Active member of club or organization: No    Attends meetings of clubs or organizations: Never    Relationship status: Living with partner  . Intimate partner violence    Fear of current or ex partner: No    Emotionally abused: No    Physically abused: No    Forced sexual activity: No  Other Topics Concern  . Not on file  Social History Narrative  . Not on file    Allergies:  Allergies  Allergen Reactions  . Benadryl [Diphenhydramine Hcl (Sleep)] Swelling  . Chocolate     migraines  . Estrogens     Increases chances of having stroke  . Latex Itching and Swelling  . Ultram [Tramadol Hcl] Itching    Medications: Prior to Admission medications   Medication Sig Start Date End Date Taking? Authorizing Provider  hydrochlorothiazide (HYDRODIURIL) 12.5 MG tablet Take 12.5 mg by mouth daily.    Yes [provider]  rizatriptan (MAXALT) 10 MG tablet Take 10 mg by mouth every 2 (two) hours as needed for migraine.  04/06/17  Yes [provider]  topiramate (TOPAMAX) 100 MG tablet Take 50 mg by mouth every morning.  04/06/17  Yes  [provider]  valACYclovir (VALTREX) 1000 MG tablet TAKE 1 TABLET BY MOUTH EVERY DAY 10/15/18  Yes Malachy Mood, MD    Physical Exam Vitals: Blood pressure 114/82, pulse (!) 111, height 5\' 8"  (1.727 m), weight 178 lb (80.7 kg), last menstrual period 11/30/2018.  General: NAD HEENT: normocephalic, anicteric Thyroid: no enlargement, no palpable nodules Pulmonary: No increased work of breathing, CTAB Cardiovascular: RRR, distal pulses 2+ Breast: Breast symmetrical, no tenderness, no palpable nodules or masses, no skin or nipple retraction present, no nipple discharge.  No axillary or supraclavicular lymphadenopathy. Abdomen: NABS, soft, non-tender, non-distended.  Umbilicus without lesions.  No hepatomegaly, splenomegaly or masses palpable. No evidence of hernia  Genitourinary:  External: Normal external female  genitalia.  Normal urethral meatus, normal Bartholin's and Skene's glands.    Vagina: Normal vaginal mucosa, no evidence of prolapse.    Cervix: Grossly normal in appearance, no bleeding  Uterus: Non-enlarged, mobile, normal contour.  No CMT  Adnexa: ovaries non-enlarged, no adnexal masses  Rectal: deferred  Lymphatic: no evidence of inguinal lymphadenopathy Extremities: no edema, erythema, or tenderness Neurologic: Grossly intact Psychiatric: mood appropriate, affect full  Female chaperone present for pelvic and breast  portions of the physical exam    Assessment: 33 y.o. G2P0020 routine annual exam  Plan: Problem List Items Addressed This Visit      Other   Endometriosis determined by laparoscopy   Relevant Orders   US Transvaginal Non-OB    Other Visit Diagnoses    Encounter for gynecological examination without abnormal finding    -  Primary   Screening for malignant neoplasm of cervix       Relevant Orders   Cytology - PAP   Breast screening       Routine screening for STI (sexually transmitted infection)       Relevant Orders   Cytology - PAP    Pelvic pain       Relevant Orders   US Transvaginal Non-OB      1) STI screening  wasoffered and declined  2)  ASCCP guidelines and rational discussed.  Patient opts for every 3 years screening interval  3) Contraception - the patient is currently using  none.  She is attempting to conceive in the near future  4) Routine healthcare maintenance including cholesterol, diabetes screening discussed managed by PCP  5) Pelvic pain - in setting of known endometriosis and prior myomectomy will obtain TVUS to evaluate   6) Return in about 2 weeks (around 01/07/2019) for TVUS and follow up.   Malachy Mood, MD, [facility], [facility] 12/24/2018, 1:30 PM

## 2018-12-26 LAB — CYTOLOGY - PAP
Chlamydia: NEGATIVE
Comment: NEGATIVE
Comment: NEGATIVE
Comment: NORMAL
Diagnosis: NEGATIVE
High risk HPV: NEGATIVE
Neisseria Gonorrhea: NEGATIVE

## 2019-01-05 ENCOUNTER — Ambulatory Visit (INDEPENDENT_AMBULATORY_CARE_PROVIDER_SITE_OTHER)
Admission: RE | Admit: 2019-01-05 | Discharge: 2019-01-05 | Disposition: A | Discharge status: Home / Self Care | Payer: Managed Care, Other (non HMO) | Source: Ambulatory Visit

## 2019-01-05 DIAGNOSIS — Z760 Encounter for issue of repeat prescription: Secondary | ICD-10-CM

## 2019-01-05 DIAGNOSIS — G43909 Migraine, unspecified, not intractable, without status migrainosus: Secondary | ICD-10-CM | POA: Diagnosis not present

## 2019-01-05 MED ORDER — TOPIRAMATE 100 MG PO TABS: 100.0000 mg | ORAL_TABLET | ORAL | 0 refills | Status: DC

## 2019-01-05 NOTE — ED Provider Notes (Signed)
Virtual Visit via Video Note:  Rachel Garrett  initiated request for Telemedicine visit with Rachel Garrett team. I connected with Rachel Garrett  on 01/05/2019 at 10:23 AM  for a synchronized telemedicine visit using a video enabled HIPPA compliant telemedicine application. I verified that I am speaking with Rachel Garrett  using two identifiers. Rachel Garrett C Kiptyn Rafuse, PA-C  was physically located in a Rachel Garrett site and Rachel Garrett was located at a different location.   The limitations of evaluation and management by telemedicine as well as the availability of in-person appointments were discussed. Patient was informed that she  may incur a bill ( including co-pay) for this virtual visit encounter. Rachel Garrett  expressed understanding and gave verbal consent to proceed with virtual visit.     History of Present Illness:Rachel Garrett  is a 33 y.o. female presents for medication refill of topiramate.  Patient states that she is on Topamax for prevention of migraines.  She has been on this medicine for approximately 2 years.  She ran out of her prescription approximately 2 days ago and has been unable to contact her primary Garrett for refills due to the holiday weekend.  States that the Topamax controls her migraines very well and she has very few migraines when taking this.  She has not developed any migraines over the past 2 days since being off her medicine.   Allergies  Allergen Reactions  . Benadryl [Diphenhydramine Hcl (Sleep)] Swelling  . Chocolate     migraines  . Estrogens     Increases chances of having stroke  . Latex Itching and Swelling  . Ultram [Tramadol Hcl] Itching     Past Medical History:  Diagnosis Date  . Anemia   . Anxiety   . Asthma    well controlled  . Concussion   . Depression   . Family history of adverse reaction to anesthesia    sister n/v, paternalcousin had trouble  waking  up  . GERD (gastroesophageal  reflux disease)    occ  . Hypertension   . Migraines    migraines     Social History   Tobacco Use  . Smoking status: Current Every Day Smoker    Packs/day: 0.25    Years: 10.00    Pack years: 2.50    Types: Cigarettes  . Smokeless tobacco: Never Used  Substance Use Topics  . Alcohol use: Yes    Alcohol/week: 2.0 standard drinks    Types: 2 Cans of beer per week    Comment: weekend  . Drug use: Not Currently    Comment: PT DENIES BUT HAS A + UDS IN 2017 FOR MARIJUANA        Observations/Objective: Physical Exam  Constitutional: She is oriented to person, place, and time and well-developed, well-nourished, and in no distress. No distress.  HENT:  Head: Normocephalic and atraumatic.  Neck:  Moving neck appropriately  Pulmonary/Chest:  Speaking in full sentences, breathing comfortably at rest  Musculoskeletal:     Comments: Moving all extremities appropriately  Neurological: She is alert and oriented to person, place, and time.  Speech clear, face symmetric     Assessment and Plan:    ICD-10-CM   1. Migraine without status migrainosus, not intractable, unspecified migraine type  G43.909   2. Medication refill  Z76.0     Refilled patient's prescription for topiramate to restart taking.  100 mg daily.  Follow-up with PCP/neurology for  further refills.  Continue to monitor for development of any symptoms.  Discussed strict return precautions. Patient verbalized understanding and is agreeable with plan.    Follow Up Instructions:     I discussed the assessment and treatment plan with the patient. The patient was provided an opportunity to ask questions and all were answered. The patient agreed with the plan and demonstrated an understanding of the instructions.   The patient was advised to call back or seek an in-person evaluation if the symptoms worsen or if the condition fails to improve as anticipated.      Rachel Lima, PA-C  01/05/2019 10:23 AM          Rachel Lima, PA-C 01/05/19 1023

## 2019-01-13 ENCOUNTER — Ambulatory Visit (INDEPENDENT_AMBULATORY_CARE_PROVIDER_SITE_OTHER): Payer: Managed Care, Other (non HMO)

## 2019-01-13 ENCOUNTER — Encounter: Payer: Self-pay | Admitting: Obstetrics and Gynecology

## 2019-01-13 ENCOUNTER — Ambulatory Visit (INDEPENDENT_AMBULATORY_CARE_PROVIDER_SITE_OTHER): Payer: Managed Care, Other (non HMO) | Admitting: Obstetrics and Gynecology

## 2019-01-13 ENCOUNTER — Other Ambulatory Visit: Payer: Self-pay

## 2019-01-13 VITALS — BP 116/84 | Wt 180.0 lb

## 2019-01-13 DIAGNOSIS — R102 Pelvic and perineal pain: Secondary | ICD-10-CM

## 2019-01-13 DIAGNOSIS — D251 Intramural leiomyoma of uterus: Secondary | ICD-10-CM

## 2019-01-13 DIAGNOSIS — N83202 Unspecified ovarian cyst, left side: Secondary | ICD-10-CM

## 2019-01-13 DIAGNOSIS — N809 Endometriosis, unspecified: Secondary | ICD-10-CM | POA: Diagnosis not present

## 2019-01-13 DIAGNOSIS — N83201 Unspecified ovarian cyst, right side: Secondary | ICD-10-CM

## 2019-01-13 NOTE — Progress Notes (Signed)
Gynecology Ultrasound Follow Up  Chief Complaint:  Chief Complaint  Patient presents with  . Follow-up    GYN Ultrasound     History of Present Illness: Patient is a 33 y.o. female who presents today for ultrasound evaluation of pelvic pain .  Ultrasound demonstrates the following findgins Adnexa: cyst seen bilateral, consistent with hemorrhagic vs endometriomas with history of prior endometrioma favor endometriomas Uterus: None enlarged, two small subserosal fibroid with endometrial stripe  Homogenous no abnormalities Additional: no free fluid  Review of Systems: Review of Systems  Constitutional: Negative.   Gastrointestinal: Positive for abdominal pain.  Genitourinary: Negative.     Past Medical History:  Past Medical History:  Diagnosis Date  . Anemia   . Anxiety   . Asthma    well controlled  . Concussion   . Depression   . Family history of adverse reaction to anesthesia    sister n/v, paternalcousin had trouble  waking  up  . GERD (gastroesophageal reflux disease)    occ  . Hypertension   . Migraines    migraines    Past Surgical History:  Past Surgical History:  Procedure Laterality Date  . HYSTEROSCOPY N/A 09/04/2017   Procedure: HYSTEROSCOPY;  Surgeon: Malachy Mood, MD;  Location: [facility];  Service: Gynecology;  Laterality: N/A;  . LAPAROSCOPY N/A 09/04/2017   Procedure: LAPAROSCOPY DIAGNOSTIC;  Surgeon: Malachy Mood, MD;  Location: [facility];  Service: Gynecology;  Laterality: N/A;  . LAPAROTOMY N/A 11/02/2016   Procedure: LAPAROTOMY;  Surgeon: Malachy Mood, MD;  Location: [facility];  Service: Gynecology;  Laterality: N/A;  . 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 MYOMECTOMY N/A 11/02/2016   Procedure: MYOMECTOMY;  Surgeon: Malachy Mood, MD;  Location: [facility];  Service: Gynecology;  Laterality: N/A;  . OVARIAN CYST REMOVAL Right 11/02/2016   Procedure: OVARIAN CYSTECTOMY;  Surgeon: Malachy Mood, MD;  Location: [facility];  Service:  Gynecology;  Laterality: Right;  . TONSILLECTOMY    . TONSILLECTOMY      Gynecologic History:  Patient's last menstrual period was 12/26/2018 (exact date).  Family History:  Family History  Problem Relation Age of Onset  . Breast cancer Maternal Aunt        [employer]  . Breast cancer Paternal Aunt 25  . Cervical cancer Maternal Aunt   . Breast cancer Paternal 22        Twin half sisters of dad    Social History:  Social History   Socioeconomic History  . Marital status: Single    Spouse name: Not on file  . Number of children: Not on file  . Years of education: Not on file  . Highest education level: Not on file  Occupational History  . Not on file  Tobacco Use  . Smoking status: Current Every Day Smoker    Packs/day: 0.25    Years: 10.00    Pack years: 2.50    Types: Cigarettes  . Smokeless tobacco: Never Used  Substance and Sexual Activity  . Alcohol use: Yes    Alcohol/week: 2.0 standard drinks    Types: 2 Cans of beer per week    Comment: weekend  . Drug use: Not Currently    Comment: PT DENIES BUT HAS A + UDS IN 2017 FOR MARIJUANA  . Sexual activity: Yes    Birth control/protection: None  Other Topics Concern  . Not on file  Social History Narrative  . Not on file   Social Determinants of Health  Financial Resource Strain:   . Difficulty of Paying Living Expenses: Not on file  Food Insecurity:   . Worried About Charity fundraiser in the Last Year: Not on file  . Ran Out of Food in the Last Year: Not on file  Transportation Needs:   . Lack of Transportation (Medical): Not on file  . Lack of Transportation (Non-Medical): Not on file  Physical Activity:   . Days of Exercise per Week: Not on file  . Minutes of Exercise per Session: Not on file  Stress:   . Feeling of Stress : Not on file  Social Connections:   . Frequency of Communication with Friends and Family: Not on file  . Frequency of Social Gatherings with Friends and Family: Not on  file  . Attends Religious Services: Not on file  . Active Member of Clubs or Organizations: Not on file  . Attends Archivist Meetings: Not on file  . Marital Status: Not on file  Intimate Partner Violence:   . Fear of Current or Ex-Partner: Not on file  . Emotionally Abused: Not on file  . Physically Abused: Not on file  . Sexually Abused: Not on file    Allergies:  Allergies  Allergen Reactions  . Benadryl [Diphenhydramine Hcl (Sleep)] Swelling  . Chocolate     migraines  . Estrogens     Increases chances of having stroke  . Latex Itching and Swelling  . Ultram [Tramadol Hcl] Itching    Medications: Prior to Admission medications   Medication Sig Start Date End Date Taking? Authorizing Provider  hydrochlorothiazide (HYDRODIURIL) 12.5 MG tablet Take 12.5 mg by mouth daily.    Yes [provider]  rizatriptan (MAXALT) 10 MG tablet Take 10 mg by mouth every 2 (two) hours as needed for migraine.  04/06/17  Yes [provider]  topiramate (TOPAMAX) 100 MG tablet Take 1 tablet (100 mg total) by mouth every morning. 01/05/19  Yes Wieters, Hallie C, PA-C  valACYclovir (VALTREX) 1000 MG tablet TAKE 1 TABLET BY MOUTH EVERY DAY 10/15/18  Yes Malachy Mood, MD    Physical Exam Vitals: Blood pressure 116/84, weight 180 lb (81.6 kg), last menstrual period 12/26/2018.  General: NAD HEENT: normocephalic, anicteric Pulmonary: No increased work of breathing Extremities: no edema, erythema, or tenderness Neurologic: Grossly intact, normal gait Psychiatric: mood appropriate, affect full  US Transvaginal Non-OB  Result Date: 01/13/2019 Patient Name: Rachel Garrett DOB: [birth year] MRN: [id number] ULTRASOUND REPORT Location: [facility] Date of Service: 01/13/2019 Indications:Pelvic Pain Findings: The uterus is anteverted and measures 8.1 x 5.6 x 4.3 cm. Echo texture is homogenous with evidence of focal masses. Within the uterus are multiple suspected  fibroids measuring: Fibroid 1:9.3 x 11.5 x 11.3 mm intramural anterior Fibroid 2:14.9 x 11.2 x 18.1 mm intramural posterior The Endometrium measures 15.7 mm. Right Ovary measures 5.4 x 4.8 x 4.2 cm. It is not normal in appearance. There are two complex cysts in the right ovary. They measures 44 x 32 x 26 mm and 44 x 34 x 27 mm. No blood flow is seen within these cysts. Left Ovary measures 5.2 x 4.4 x 4.4 cm. It is not normal in appearance. There is a complex cyst seen in the left ovary measuring 48 x 41 x 36 mm. There is blood flow seen around the periphery of this cyst only. Survey of the adnexa demonstrates no adnexal masses. There is no free fluid in the cul de sac. Impression: 1. There  are two small fibroids seen. 2. There are two complex cysts seen in the right ovary. 3. There is one complex cyst seen in the left ovary. Recommendations: 1.Clinical correlation with the patient's History and Physical Exam. Gweneth Dimitri, RT Images reviewed.  Uterus appears stable post prior myomectomy.  Bilateral ovaries show complex cysts.  In the setting of endometriosis the most likely etiology is endometriomas. Malachy Mood, MD, [facility], [facility] 01/13/2019, 4:07 PM    Assessment: 33 y.o. G2P0020 with likely bilateral ovarian endometriomas  Plan: Problem List Items Addressed This Visit      Other   Endometriosis determined by laparoscopy    Other Visit Diagnoses    Pelvic pain    -  Primary   Bilateral ovarian cysts          1) Bilateral ovarian cysts in the setting of laproscopically proven endometriosis  - Laproscopic bilateral ovarian cystectomy, chromopertubation Scheduling request sent - restart norethindrone   2) A total of 15 minutes were spent in face-to-face contact with the patient during this encounter with over half of that time devoted to counseling and coordination of care.  3) Return if symptoms worsen or fail to improve.    Malachy Mood, MD,  [facility], [place]

## 2019-01-14 ENCOUNTER — Telehealth: Payer: Self-pay | Admitting: Obstetrics and Gynecology

## 2019-01-14 NOTE — Telephone Encounter (Signed)
No pain medication can be written, the norethindrone she was written was to cover any endometriosis pain

## 2019-01-14 NOTE — Telephone Encounter (Signed)
Patient is aware of H&P on 1/11 @ 1:30pm w/ Dr. Georgianne Fick, Pre-admit testing to be scheduled, COVID testing on 1/19, and OR on 02/27/19. Patient is aware to quarantine after COVID testing. Patient is aware she may receive calls from the [facility] and [facility]. Patient confirmed she currently has [place] and no secondary insurance, but may have different insurance soon. Patient is aware to call me right away if she has new insurance. Patient requests pain medication as her surgery is a long way out.

## 2019-01-14 NOTE — Telephone Encounter (Signed)
-----   Message from Malachy Mood, MD sent at 01/13/2019  4:20 PM EST ----- Regarding: surgery Surgery Booking Request Patient Full Name:  ADIAH DRAGOTTA  MRN: [id number]  DOB: [birth year]  Surgeon: Malachy Mood, MD  Requested Surgery Date and Time: 2-6 weeks Primary Diagnosis AND Code: Bilateral ovarian cysts N83.29 Secondary Diagnosis and Code: Endometriosis N80.9, Pelvic Pain R10.2 Surgical Procedure: Laparoscopy with Ovarian Cystectomy, chromopertubation L&D Notification: No Admission Status: same day surgery Length of Surgery: 1hr Special Case Needs: No H&P: Yes Phone Interview???:  yes Interpreter: No Language:  Medical Clearance:  No Special Scheduling Instructions:  Any known health/anesthesia issues, diabetes, sleep apnea, latex allergy, defibrillator/pacemaker?: No Acuity: P3   (P1 highest, P2 delay may cause harm, P3 low, elective gyn, P4 lowest)

## 2019-01-15 NOTE — Telephone Encounter (Signed)
Pt aware.

## 2019-02-17 ENCOUNTER — Encounter: Payer: Self-pay | Admitting: Obstetrics and Gynecology

## 2019-02-17 ENCOUNTER — Ambulatory Visit (INDEPENDENT_AMBULATORY_CARE_PROVIDER_SITE_OTHER): Payer: Self-pay | Admitting: Obstetrics and Gynecology

## 2019-02-17 ENCOUNTER — Other Ambulatory Visit: Payer: Self-pay

## 2019-02-17 VITALS — BP 130/84 | HR 96 | Ht 67.0 in | Wt 178.0 lb

## 2019-02-17 DIAGNOSIS — Z01818 Encounter for other preprocedural examination: Secondary | ICD-10-CM

## 2019-02-17 NOTE — Progress Notes (Signed)
Obstetrics & Gynecology Surgery H&P    Chief Complaint: Scheduled Surgery   History of Present Illness: Patient is a 34 y.o. [id number] presenting for scheduled laparoscopic bilateral salpingectomy for the treatment or further evaluation of endometriomas.   Prior Treatments prior to proceeding with surgery include: norethindrone/hormonal management  Preoperative Pap: 12/24/2018 NIL HPV negative Preoperative Endometrial biopsy: N/A Preoperative Ultrasound: 01/13/2019 showing complex bilateral adnexal masses favored to be endometrioma's given patient's personal history of endometriosis   Review of Systems:10 point review of systems  Past Medical History:  Past Medical History:  Diagnosis Date  . Anemia   . Anxiety   . Asthma    well controlled  . Concussion   . Depression   . Family history of adverse reaction to anesthesia    sister n/v, paternalcousin had trouble  waking  up  . GERD (gastroesophageal reflux disease)    occ  . Hypertension   . Migraines    migraines    Past Surgical History:  Past Surgical History:  Procedure Laterality Date  . HYSTEROSCOPY N/A 09/04/2017   Procedure: HYSTEROSCOPY;  Surgeon: Malachy Mood, MD;  Location: [facility];  Service: Gynecology;  Laterality: N/A;  . LAPAROSCOPY N/A 09/04/2017   Procedure: LAPAROSCOPY DIAGNOSTIC;  Surgeon: Malachy Mood, MD;  Location: [facility];  Service: Gynecology;  Laterality: N/A;  . LAPAROTOMY N/A 11/02/2016   Procedure: LAPAROTOMY;  Surgeon: Malachy Mood, MD;  Location: [facility];  Service: Gynecology;  Laterality: N/A;  . 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 MYOMECTOMY N/A 11/02/2016   Procedure: MYOMECTOMY;  Surgeon: Malachy Mood, MD;  Location: [facility];  Service: Gynecology;  Laterality: N/A;  . OVARIAN CYST REMOVAL Right 11/02/2016   Procedure: OVARIAN CYSTECTOMY;  Surgeon: Malachy Mood, MD;  Location: [facility];  Service: Gynecology;  Laterality: Right;  . TONSILLECTOMY    . TONSILLECTOMY       Family History:  Family History  Problem Relation Age of Onset  . Breast cancer Maternal Aunt        [employer]  . Breast cancer Paternal Aunt 9  . Cervical cancer Maternal Aunt   . Breast cancer Paternal 19        Twin half sisters of dad    Social History:  Social History   Socioeconomic History  . Marital status: Single    Spouse name: Not on file  . Number of children: Not on file  . Years of education: Not on file  . Highest education level: Not on file  Occupational History  . Not on file  Tobacco Use  . Smoking status: Current Every Day Smoker    Packs/day: 0.25    Years: 10.00    Pack years: 2.50    Types: Cigarettes  . Smokeless tobacco: Never Used  Substance and Sexual Activity  . Alcohol use: Yes    Alcohol/week: 2.0 standard drinks    Types: 2 Cans of beer per week    Comment: weekend  . Drug use: Not Currently    Comment: PT DENIES BUT HAS A + UDS IN 2017 FOR MARIJUANA  . Sexual activity: Yes    Birth control/protection: None  Other Topics Concern  . Not on file  Social History Narrative  . Not on file   Social Determinants of Health   Financial Resource Strain:   . Difficulty of Paying Living Expenses: Not on file  Food Insecurity:   . Worried About Charity fundraiser in the Last Year: Not on  file  . [place] in the Last Year: Not on file  Transportation Needs:   . Lack of Transportation (Medical): Not on file  . Lack of Transportation (Non-Medical): Not on file  Physical Activity:   . Days of Exercise per Week: Not on file  . Minutes of Exercise per Session: Not on file  Stress:   . Feeling of Stress : Not on file  Social Connections:   . Frequency of Communication with Friends and Family: Not on file  . Frequency of Social Gatherings with Friends and Family: Not on file  . Attends Religious Services: Not on file  . Active Member of Clubs or Organizations: Not on file  . Attends Archivist Meetings: Not on file   . Marital Status: Not on file  Intimate Partner Violence:   . Fear of Current or Ex-Partner: Not on file  . Emotionally Abused: Not on file  . Physically Abused: Not on file  . Sexually Abused: Not on file    Allergies:  Allergies  Allergen Reactions  . Bee Venom Swelling  . Benadryl [Diphenhydramine Hcl (Sleep)] Swelling  . Chocolate     migraines  . Estrogens     Increases chances of having stroke  . Latex Itching and Swelling  . Ultram [Tramadol Hcl] Itching    Medications: Prior to Admission medications   Medication Sig Start Date End Date Taking? Authorizing Provider  fluticasone (FLONASE) 50 MCG/ACT nasal spray Place into both nostrils daily.   Yes [provider]  hydrochlorothiazide (HYDRODIURIL) 12.5 MG tablet Take 12.5 mg by mouth daily.    Yes [provider]  rizatriptan (MAXALT) 10 MG tablet Take 10 mg by mouth every 2 (two) hours as needed for migraine.  04/06/17  Yes [provider]  topiramate (TOPAMAX) 100 MG tablet Take 1 tablet (100 mg total) by mouth every morning. 01/05/19  Yes Wieters, Hallie C, PA-C  valACYclovir (VALTREX) 1000 MG tablet TAKE 1 TABLET BY MOUTH EVERY DAY Patient taking differently: Take 1,000 mg by mouth daily.  10/15/18  Yes Malachy Mood, MD    Physical Exam Vitals: Blood pressure 130/84, pulse 96, height 5\' 7"  (1.702 m), weight 178 lb (80.7 kg), last menstrual period 01/22/2019. General: NAD HEENT: normocephalic, anicteric Pulmonary: CTAB, No increased work of breathing Cardiovascular: RRR, distal pulses 2+ Abdomen: soft, non-tender, non-distended Extremities: no edema, erythema, or tenderness Neurologic: Grossly intact Psychiatric: mood appropriate, affect full  Imaging US Transvaginal Non-OB  Result Date: 01/13/2019 Patient Name: Rachel Garrett DOB: September 23, 1985 MRN: [id number] ULTRASOUND REPORT Location: [facility] Date of Service: 01/13/2019 Indications:Pelvic Pain Findings: The uterus is  anteverted and measures 8.1 x 5.6 x 4.3 cm. Echo texture is homogenous with evidence of focal masses. Within the uterus are multiple suspected fibroids measuring: Fibroid 1:9.3 x 11.5 x 11.3 mm intramural anterior Fibroid 2:14.9 x 11.2 x 18.1 mm intramural posterior The Endometrium measures 15.7 mm. Right Ovary measures 5.4 x 4.8 x 4.2 cm. It is not normal in appearance. There are two complex cysts in the right ovary. They measures 44 x 32 x 26 mm and 44 x 34 x 27 mm. No blood flow is seen within these cysts. Left Ovary measures 5.2 x 4.4 x 4.4 cm. It is not normal in appearance. There is a complex cyst seen in the left ovary measuring 48 x 41 x 36 mm. There is blood flow seen around the periphery of this cyst only. Survey of the adnexa demonstrates  no adnexal masses. There is no free fluid in the cul de sac. Impression: 1. There are two small fibroids seen. 2. There are two complex cysts seen in the right ovary. 3. There is one complex cyst seen in the left ovary. Recommendations: 1.Clinical correlation with the patient's History and Physical Exam. Gweneth Dimitri, RT Images reviewed.  Uterus appears stable post prior myomectomy.  Bilateral ovaries show complex cysts.  In the setting of endometriosis the most likely etiology is endometriomas. Malachy Mood, MD, [facility], [facility] 01/13/2019, 4:07 PM    Findings second look laparoscopy 09/05/2017   Assessment: 34 y.o. G2P0020 presenting for scheduled laparoscopic bilateral ovarian cystectomy  Plan: 1) I have had a careful discussion with this patient about all the options available and the risk/benefits of each. I have fully informed this patient that a laparoscopy may subject her to a variety of discomforts and risks: She understands that most patients have surgery with little difficulty, but problems can happen ranging from minor to fatal. These include nausea, vomiting, pain, bleeding, infection, poor healing, hernia, or  formation of adhesions. Unexpected reactions may occur from any drug or anesthetic given. Unintended injury may occur to other pelvic or abdominal structures such as Fallopian tubes, ovaries, bladder, ureter (tube from kidney to bladder), or bowel. Nerves going from the pelvis to the legs may be injured. Any such injury may require immediate or later additional surgery to correct the problem. Excessive blood loss requiring transfusion is very unlikely but possible. Dangerous blood clots may form in the legs or lungs. Physical and sexual activity will be restricted in varying degrees for an indeterminate period of time but most often 2-4 weeks. She understands that the plan is to do this laparoscopically, however, there is a chance that this will need to be performed via a larger incision. Finally, she understands that it is impossible to list every possible undesirable effect and that the condition for which surgery is done is not always cured or significantly improved, and in rare cases may be even worsen. Ample time was given to answer all questions.  2) Routine postoperative instructions were reviewed with the patient and her family in detail today including the expected length of recovery and likely postoperative course.  The patient concurred with the proposed plan, giving informed written consent for the surgery today.  Patient instructed on the importance of being NPO after midnight prior to her procedure.  If warranted preoperative prophylactic antibiotics and SCDs ordered on call to the OR to meet SCIP guidelines and adhere to recommendation laid forth in [place] Number 104 May 2009  "Antibiotic Prophylaxis for Gynecologic Procedures".     Malachy Mood, MD, [facility], [facility] 02/17/2019, 1:45 PM

## 2019-02-18 ENCOUNTER — Other Ambulatory Visit: Payer: Self-pay | Admitting: Obstetrics and Gynecology

## 2019-02-18 ENCOUNTER — Other Ambulatory Visit: Payer: Self-pay

## 2019-02-18 ENCOUNTER — Encounter
Admission: RE | Admit: 2019-02-18 | Discharge: 2019-02-18 | Disposition: A | Discharge status: Home / Self Care | Payer: Self-pay | Source: Ambulatory Visit | Attending: Obstetrics and Gynecology | Admitting: Obstetrics and Gynecology

## 2019-02-18 ENCOUNTER — Inpatient Hospital Stay: Admission: RE | Admit: 2019-02-18 | Payer: Self-pay | Source: Ambulatory Visit

## 2019-02-18 DIAGNOSIS — Z01812 Encounter for preprocedural laboratory examination: Secondary | ICD-10-CM | POA: Insufficient documentation

## 2019-02-18 LAB — CBC
HCT: 38.3 % (ref 36.0–46.0)
Hemoglobin: 12.7 g/dL (ref 12.0–15.0)
MCH: 30 pg (ref 26.0–34.0)
MCHC: 33.2 g/dL (ref 30.0–36.0)
MCV: 90.3 fL (ref 80.0–100.0)
Platelets: 184 10*3/uL (ref 150–400)
RBC: 4.24 MIL/uL (ref 3.87–5.11)
RDW: 13.9 % (ref 11.5–15.5)
WBC: 7 10*3/uL (ref 4.0–10.5)
nRBC: 0 % (ref 0.0–0.2)

## 2019-02-18 LAB — POTASSIUM: Potassium: 3.4 mmol/L — ABNORMAL LOW (ref 3.5–5.1)

## 2019-02-18 MED ORDER — POTASSIUM CHLORIDE ER 10 MEQ PO TBCR: 10.0000 meq | EXTENDED_RELEASE_TABLET | Freq: Every day | ORAL | 0 refills | Status: DC

## 2019-02-18 NOTE — Patient Instructions (Addendum)
Your procedure is scheduled on: 02/27/19 Report to [place]. To find out your arrival time please call [phone] between 1PM - 3PM on 02/26/19 .  Remember: Instructions that are not followed completely may result in serious medical risk, up to and including death, or upon the discretion of your surgeon and anesthesiologist your surgery may need to be rescheduled.     _X__ 1. Do not eat food after midnight the night before your procedure.                 No gum chewing or hard candies. You may drink clear liquids up to 2 hours                 before you are scheduled to arrive for your surgery- DO not drink clear                 liquids within 2 hours of the start of your surgery.                 Clear Liquids include:  water, apple juice without pulp, clear carbohydrate                 drink such as Clearfast or Gatorade, Black Coffee or Tea (Do not add                 anything to coffee or tea). Diabetics water only  __X__2.  On the morning of surgery brush your teeth with toothpaste and water, you                 may rinse your mouth with mouthwash if you wish.  Do not swallow any              toothpaste of mouthwash.     _X__ 3.  No Alcohol for 24 hours before or after surgery.   _X__ 4.  Do Not Smoke or use e-cigarettes For 24 Hours Prior to Your Surgery.                 Do not use any chewable tobacco products for at least 6 hours prior to                 surgery.  ____  5.  Bring all medications with you on the day of surgery if instructed.   __X__  6.  Notify your doctor if there is any change in your medical condition      (cold, fever, infections).     Do not wear jewelry, make-up, hairpins, clips or nail polish. Do not wear lotions, powders, or perfumes.  Do not shave 48 hours prior to surgery. Men may shave face and neck. Do not bring valuables to the hospital.    [facility] is not responsible for any belongings  or valuables.  Contacts, dentures/partials or body piercings may not be worn into surgery. Bring a case for your contacts, glasses or hearing aids, a denture cup will be supplied. Leave your suitcase in the car. After surgery it may be brought to your room. For patients admitted to the hospital, discharge time is determined by your treatment team.   Patients discharged the day of surgery will not be allowed to drive home.   Please read over the following fact sheets that you were given:   MRSA Information  __X__ Take these medicines the morning of surgery with A SIP OF WATER:  1. VALTREX  2. TOPIRIMATE  3.   4.  5.  6.  ____ Fleet Enema (as directed)   __X__ Use CHG Soap/SAGE wipes as directed  ____ Use inhalers on the day of surgery  ____ Stop metformin/Janumet/Farxiga 2 days prior to surgery    ____ Take 1/2 of usual insulin dose the night before surgery. No insulin the morning          of surgery.   ____ Stop Blood Thinners Coumadin/Plavix/Xarelto/Pleta/Pradaxa/Eliquis/Effient/Aspirin  on   Or contact your Surgeon, Cardiologist or Medical Doctor regarding  ability to stop your blood thinners  __X__ Stop Anti-inflammatories 7 days before surgery such as Advil, Ibuprofen, Motrin,  BC or Goodies Powder, Naprosyn, Naproxen, Aleve, Aspirin    __X__ Stop all herbal supplements, fish oil or vitamin E until after surgery.    ____ Bring C-Pap to the hospital.

## 2019-02-25 ENCOUNTER — Other Ambulatory Visit: Payer: Self-pay

## 2019-02-25 ENCOUNTER — Other Ambulatory Visit
Admission: RE | Admit: 2019-02-25 | Discharge: 2019-02-25 | Disposition: A | Discharge status: Home / Self Care | Payer: Managed Care, Other (non HMO) | Source: Ambulatory Visit | Attending: Obstetrics and Gynecology | Admitting: Obstetrics and Gynecology

## 2019-02-25 DIAGNOSIS — Z01812 Encounter for preprocedural laboratory examination: Secondary | ICD-10-CM | POA: Insufficient documentation

## 2019-02-25 DIAGNOSIS — Z20822 Contact with and (suspected) exposure to covid-19: Secondary | ICD-10-CM | POA: Insufficient documentation

## 2019-02-25 LAB — SARS CORONAVIRUS 2 (TAT 6-24 HRS): SARS Coronavirus 2: NEGATIVE

## 2019-02-27 ENCOUNTER — Encounter
Admission: RE | Disposition: A | Discharge status: Home / Self Care | Payer: Self-pay | Source: Ambulatory Visit | Attending: Obstetrics and Gynecology

## 2019-02-27 ENCOUNTER — Ambulatory Visit
Admission: RE | Admit: 2019-02-27 | Discharge: 2019-02-27 | Disposition: A | Discharge status: Home / Self Care | Payer: Self-pay | Source: Ambulatory Visit | Attending: Obstetrics and Gynecology | Admitting: Obstetrics and Gynecology

## 2019-02-27 ENCOUNTER — Ambulatory Visit: Payer: Self-pay | Admitting: Anesthesiology

## 2019-02-27 ENCOUNTER — Other Ambulatory Visit: Payer: Self-pay

## 2019-02-27 ENCOUNTER — Encounter: Payer: Self-pay | Admitting: Obstetrics and Gynecology

## 2019-02-27 DIAGNOSIS — G43909 Migraine, unspecified, not intractable, without status migrainosus: Secondary | ICD-10-CM | POA: Insufficient documentation

## 2019-02-27 DIAGNOSIS — Z9889 Other specified postprocedural states: Secondary | ICD-10-CM

## 2019-02-27 DIAGNOSIS — N83201 Unspecified ovarian cyst, right side: Secondary | ICD-10-CM

## 2019-02-27 DIAGNOSIS — Z79899 Other long term (current) drug therapy: Secondary | ICD-10-CM | POA: Insufficient documentation

## 2019-02-27 DIAGNOSIS — N83202 Unspecified ovarian cyst, left side: Secondary | ICD-10-CM | POA: Insufficient documentation

## 2019-02-27 DIAGNOSIS — N809 Endometriosis, unspecified: Secondary | ICD-10-CM

## 2019-02-27 DIAGNOSIS — F419 Anxiety disorder, unspecified: Secondary | ICD-10-CM | POA: Insufficient documentation

## 2019-02-27 DIAGNOSIS — I10 Essential (primary) hypertension: Secondary | ICD-10-CM | POA: Insufficient documentation

## 2019-02-27 DIAGNOSIS — N801 Endometriosis of ovary: Secondary | ICD-10-CM | POA: Insufficient documentation

## 2019-02-27 HISTORY — PX: CHROMOPERTUBATION: SHX6288

## 2019-02-27 HISTORY — PX: LAPAROSCOPIC OVARIAN CYSTECTOMY: SHX6248

## 2019-02-27 LAB — TYPE AND SCREEN
ABO/RH(D): O POS
Antibody Screen: NEGATIVE

## 2019-02-27 LAB — POCT PREGNANCY, URINE: Preg Test, Ur: NEGATIVE

## 2019-02-27 SURGERY — EXCISION, CYST, OVARY, LAPAROSCOPIC
Anesthesia: General

## 2019-02-27 MED ORDER — FENTANYL CITRATE (PF) 100 MCG/2ML IJ SOLN
25.0000 ug | INTRAMUSCULAR | Status: DC | PRN
  Administered 2019-02-27 (×3): 25 ug via INTRAVENOUS

## 2019-02-27 MED ORDER — BUPIVACAINE HCL 0.5 % IJ SOLN
INTRAMUSCULAR | Status: DC | PRN
  Administered 2019-02-27: 12 mL

## 2019-02-27 MED ORDER — METOCLOPRAMIDE HCL 5 MG/ML IJ SOLN
INTRAMUSCULAR | Status: AC
  Administered 2019-02-27: 10 mg via INTRAVENOUS
  Filled 2019-02-27: qty 2

## 2019-02-27 MED ORDER — DEXAMETHASONE SODIUM PHOSPHATE 10 MG/ML IJ SOLN
INTRAMUSCULAR | Status: AC
  Filled 2019-02-27: qty 1

## 2019-02-27 MED ORDER — MIDAZOLAM HCL 2 MG/2ML IJ SOLN
INTRAMUSCULAR | Status: AC
  Filled 2019-02-27: qty 2

## 2019-02-27 MED ORDER — SUGAMMADEX SODIUM 200 MG/2ML IV SOLN
INTRAVENOUS | Status: AC
  Filled 2019-02-27: qty 2

## 2019-02-27 MED ORDER — MIDAZOLAM HCL 2 MG/2ML IJ SOLN
INTRAMUSCULAR | Status: DC | PRN
  Administered 2019-02-27: 2 mg via INTRAVENOUS

## 2019-02-27 MED ORDER — METHYLENE BLUE 0.5 % INJ SOLN
INTRAVENOUS | Status: DC | PRN
  Administered 2019-02-27: 10 mL

## 2019-02-27 MED ORDER — LACTATED RINGERS IV SOLN: INTRAVENOUS | Status: DC

## 2019-02-27 MED ORDER — PROPOFOL 10 MG/ML IV BOLUS
INTRAVENOUS | Status: AC
  Filled 2019-02-27: qty 20

## 2019-02-27 MED ORDER — LIDOCAINE HCL (PF) 2 % IJ SOLN
INTRAMUSCULAR | Status: AC
  Filled 2019-02-27: qty 10

## 2019-02-27 MED ORDER — FENTANYL CITRATE (PF) 100 MCG/2ML IJ SOLN
INTRAMUSCULAR | Status: DC | PRN
  Administered 2019-02-27 (×2): 50 ug via INTRAVENOUS

## 2019-02-27 MED ORDER — ONDANSETRON HCL 4 MG/2ML IJ SOLN
INTRAMUSCULAR | Status: DC | PRN
  Administered 2019-02-27: 4 mg via INTRAVENOUS

## 2019-02-27 MED ORDER — PHENYLEPHRINE HCL (PRESSORS) 10 MG/ML IV SOLN
INTRAVENOUS | Status: AC
  Filled 2019-02-27: qty 1

## 2019-02-27 MED ORDER — FAMOTIDINE 20 MG PO TABS
20.0000 mg | ORAL_TABLET | Freq: Once | ORAL | Status: AC
  Administered 2019-02-27: 08:00:00 20 mg via ORAL

## 2019-02-27 MED ORDER — FAMOTIDINE 20 MG PO TABS
ORAL_TABLET | ORAL | Status: AC
  Filled 2019-02-27: qty 1

## 2019-02-27 MED ORDER — OXYCODONE-ACETAMINOPHEN 5-325 MG PO TABS: 1.0000 | ORAL_TABLET | ORAL | 0 refills | Status: DC | PRN

## 2019-02-27 MED ORDER — PROPOFOL 10 MG/ML IV BOLUS
INTRAVENOUS | Status: DC | PRN
  Administered 2019-02-27: 160 mg via INTRAVENOUS

## 2019-02-27 MED ORDER — BUPIVACAINE HCL (PF) 0.5 % IJ SOLN
INTRAMUSCULAR | Status: AC
  Filled 2019-02-27: qty 30

## 2019-02-27 MED ORDER — PHENYLEPHRINE HCL (PRESSORS) 10 MG/ML IV SOLN
INTRAVENOUS | Status: DC | PRN
  Administered 2019-02-27 (×2): 100 ug via INTRAVENOUS

## 2019-02-27 MED ORDER — DEXAMETHASONE SODIUM PHOSPHATE 10 MG/ML IJ SOLN
INTRAMUSCULAR | Status: DC | PRN
  Administered 2019-02-27: 10 mg via INTRAVENOUS

## 2019-02-27 MED ORDER — ONDANSETRON HCL 4 MG/2ML IJ SOLN: 4.0000 mg | Freq: Once | INTRAMUSCULAR | Status: DC | PRN

## 2019-02-27 MED ORDER — OXYCODONE-ACETAMINOPHEN 5-325 MG PO TABS
ORAL_TABLET | ORAL | Status: AC
  Administered 2019-02-27: 1 via ORAL
  Filled 2019-02-27: qty 1

## 2019-02-27 MED ORDER — METHYLENE BLUE 0.5 % INJ SOLN
INTRAVENOUS | Status: AC
  Filled 2019-02-27: qty 10

## 2019-02-27 MED ORDER — ONDANSETRON HCL 4 MG/2ML IJ SOLN
INTRAMUSCULAR | Status: AC
  Filled 2019-02-27: qty 2

## 2019-02-27 MED ORDER — FENTANYL CITRATE (PF) 100 MCG/2ML IJ SOLN
INTRAMUSCULAR | Status: AC
  Filled 2019-02-27: qty 2

## 2019-02-27 MED ORDER — METOCLOPRAMIDE HCL 5 MG/ML IJ SOLN: 10.0000 mg | Freq: Once | INTRAMUSCULAR | Status: AC

## 2019-02-27 MED ORDER — SUGAMMADEX SODIUM 200 MG/2ML IV SOLN
INTRAVENOUS | Status: DC | PRN
  Administered 2019-02-27: 200 mg via INTRAVENOUS

## 2019-02-27 MED ORDER — IBUPROFEN 600 MG PO TABS: 600.0000 mg | ORAL_TABLET | Freq: Four times a day (QID) | ORAL | 3 refills | Status: DC | PRN

## 2019-02-27 MED ORDER — OXYCODONE-ACETAMINOPHEN 5-325 MG PO TABS: 1.0000 | ORAL_TABLET | Freq: Once | ORAL | Status: AC

## 2019-02-27 MED ORDER — HEMOSTATIC AGENTS (NO CHARGE) OPTIME
TOPICAL | Status: DC | PRN
  Administered 2019-02-27: 1 via TOPICAL

## 2019-02-27 MED ORDER — ROCURONIUM BROMIDE 50 MG/5ML IV SOLN
INTRAVENOUS | Status: AC
  Filled 2019-02-27: qty 1

## 2019-02-27 MED ORDER — FENTANYL CITRATE (PF) 100 MCG/2ML IJ SOLN
INTRAMUSCULAR | Status: AC
  Administered 2019-02-27: 25 ug via INTRAVENOUS
  Filled 2019-02-27: qty 2

## 2019-02-27 MED ORDER — ACETAMINOPHEN 10 MG/ML IV SOLN
INTRAVENOUS | Status: DC | PRN
  Administered 2019-02-27: 1000 mg via INTRAVENOUS

## 2019-02-27 MED ORDER — ROCURONIUM BROMIDE 100 MG/10ML IV SOLN
INTRAVENOUS | Status: DC | PRN
  Administered 2019-02-27: 10 mg via INTRAVENOUS
  Administered 2019-02-27: 40 mg via INTRAVENOUS

## 2019-02-27 MED ORDER — LIDOCAINE HCL (CARDIAC) PF 100 MG/5ML IV SOSY
PREFILLED_SYRINGE | INTRAVENOUS | Status: DC | PRN
  Administered 2019-02-27: 100 mg via INTRAVENOUS

## 2019-02-27 MED ORDER — ACETAMINOPHEN 10 MG/ML IV SOLN
INTRAVENOUS | Status: AC
  Filled 2019-02-27: qty 100

## 2019-02-27 SURGICAL SUPPLY — 43 items
ANCHOR TIS RET SYS 235ML (MISCELLANEOUS) IMPLANT
APPLICATOR ARISTA FLEXITIP XL (MISCELLANEOUS) ×2 IMPLANT
BAG URINE DRAIN 2000ML AR STRL (UROLOGICAL SUPPLIES) ×2 IMPLANT
BLADE SURG SZ11 CARB STEEL (BLADE) ×2 IMPLANT
CANISTER SUCT 1200ML W/VALVE (MISCELLANEOUS) ×2 IMPLANT
CATH FOLEY 2WAY  5CC 16FR (CATHETERS) ×1
CATH ROBINSON RED A/P 16FR (CATHETERS) ×2 IMPLANT
CATH URTH 16FR FL 2W BLN LF (CATHETERS) ×1 IMPLANT
CHLORAPREP W/TINT 26 (MISCELLANEOUS) ×2 IMPLANT
COVER WAND RF STERILE (DRAPES) ×2 IMPLANT
DERMABOND ADVANCED (GAUZE/BANDAGES/DRESSINGS) ×1
DERMABOND ADVANCED .7 DNX12 (GAUZE/BANDAGES/DRESSINGS) ×1 IMPLANT
GLOVE BIO SURGEON STRL SZ7 (GLOVE) ×2 IMPLANT
GLOVE INDICATOR 7.5 STRL GRN (GLOVE) ×2 IMPLANT
GOWN STRL REUS W/ TWL LRG LVL3 (GOWN DISPOSABLE) ×2 IMPLANT
GOWN STRL REUS W/ TWL XL LVL3 (GOWN DISPOSABLE) IMPLANT
GOWN STRL REUS W/TWL LRG LVL3 (GOWN DISPOSABLE) ×2
GOWN STRL REUS W/TWL XL LVL3 (GOWN DISPOSABLE)
GRASPER SUT TROCAR 14GX15 (MISCELLANEOUS) IMPLANT
HEMOSTAT ARISTA ABSORB 1G (HEMOSTASIS) ×2 IMPLANT
IRRIGATION STRYKERFLOW (MISCELLANEOUS) ×1 IMPLANT
IRRIGATOR STRYKERFLOW (MISCELLANEOUS) ×2
IV LACTATED RINGERS 1000ML (IV SOLUTION) ×2 IMPLANT
IV SET EXTENSION MINI BORE EPI (IV SETS) ×2 IMPLANT
KIT PINK PAD W/HEAD ARE REST (MISCELLANEOUS) ×2
KIT PINK PAD W/HEAD ARM REST (MISCELLANEOUS) ×1 IMPLANT
KIT TURNOVER CYSTO (KITS) ×2 IMPLANT
LABEL OR SOLS (LABEL) ×2 IMPLANT
MANIPULATOR UTERINE 4.5 ZUMI (MISCELLANEOUS) ×2 IMPLANT
NS IRRIG 500ML POUR BTL (IV SOLUTION) ×2 IMPLANT
PACK GYN LAPAROSCOPIC (MISCELLANEOUS) ×2 IMPLANT
PAD OB MATERNITY 4.3X12.25 (PERSONAL CARE ITEMS) ×2 IMPLANT
PAD PREP 24X41 OB/GYN DISP (PERSONAL CARE ITEMS) ×2 IMPLANT
SCISSORS METZENBAUM CVD 33 (INSTRUMENTS) IMPLANT
SET TUBE SMOKE EVAC HIGH FLOW (TUBING) ×2 IMPLANT
SHEARS HARMONIC ACE PLUS 36CM (ENDOMECHANICALS) ×2 IMPLANT
SLEEVE ENDOPATH XCEL 5M (ENDOMECHANICALS) ×4 IMPLANT
SUT MNCRL AB 4-0 PS2 18 (SUTURE) ×2 IMPLANT
SUT VIC AB 2-0 UR6 27 (SUTURE) ×2 IMPLANT
SYR 50ML LL SCALE MARK (SYRINGE) ×2 IMPLANT
TOWEL OR 17X26 4PK STRL BLUE (TOWEL DISPOSABLE) ×2 IMPLANT
TROCAR ENDO BLADELESS 11MM (ENDOMECHANICALS) ×2 IMPLANT
TROCAR XCEL NON-BLD 5MMX100MML (ENDOMECHANICALS) ×2 IMPLANT

## 2019-02-27 NOTE — H&P (Signed)
Date of Initial H&P: 02/17/2019  History reviewed, patient examined, no change in status, stable for surgery.

## 2019-02-27 NOTE — Anesthesia Postprocedure Evaluation (Signed)
Anesthesia Post Note  Patient: Rachel Garrett  Procedure(s) Performed: LAPAROSCOPIC OVARIAN CYSTECTOMY (N/A ) CHROMOPERTUBATION (N/A )  Patient location during evaluation: PACU Anesthesia Type: General Level of consciousness: awake and alert Pain management: pain level controlled Vital Signs Assessment: post-procedure vital signs reviewed and stable Respiratory status: spontaneous breathing and respiratory function stable Cardiovascular status: stable Anesthetic complications: no     Last Vitals:  Vitals:   02/27/19 1128 02/27/19 1139  BP:  (!) 135/95  Pulse: 95 95  Resp: 16 (!) 24  Temp:    SpO2: 100% 100%    Last Pain:  Vitals:   02/27/19 1139  TempSrc:   PainSc: 5                  Tri Chittick K

## 2019-02-27 NOTE — Op Note (Addendum)
Preoperative Diagnosis: 1) 34 y.o.  Bilateral ovarian cysts 2) Endometriosis   Postoperative Diagnosis: 1) 34 y.o. Bilateral ovarian cysts 2) Endometriosis   Operation Performed: Laparoscopic right ovarian cystectomy, lysis of left ovarian cyst, chromopertubation  Indication: 34 y.o. G2P0020  with previously diagnosed endometriosis with bilateral complex ovarian cysts on ultrasound  Surgeon: Malachy Mood, MD  Anesthesia: General  Preoperative Antibiotics: none  Estimated Blood Loss: 30 mL  IV Fluids: 1L  Urine Output:: 68mL straight cath at start of case  Drains or Tubes: none  Implants: none  Specimens Removed: ovarian cyst wall  Complications: none  Intraoperative Findings: Normal tubes, left ovary with small hemorrhagic cyst 1.5cm, right ovary grossly enlarged adherent to pelvic sidewall and containing two endometriomas, normal uterus. There was some mild evidence of endometriosis in the posterior cul de sac between the uterosacral ligament.  Chromopertubation showing spill from left tube not from right tube.     Patient Condition: stable  Procedure in Detail:  Patient was taken to the operating room where she was administered general anesthesia.  She was positioned in the dorsal lithotomy position utilizing Allen stirups, prepped and draped in the usual sterile fashion.  Prior to proceeding with procedure a time out was performed.  Attention was turned to the patient's pelvis.  A red rubber catheter was used to empty the patient's bladder.  An operative speculum was placed to allow visualization of the cervix.  The anterior lip of the cervix was grasped with a single tooth tenaculum, and a Zoomy was placed to allow manipulation of the uterus.  The operative speculum and single tooth tenaculum were then removed.  Attention was turned to the patient's abdomen.  The umbilicus was infiltrated with 1% Sensorcaine, before making a stab incision using an 11 blade scalpel.  A  21mm Excel trocar was then used to gain direct entry into the peritoneal cavity utilizing the camera to visualize progress of the trocar during placement.  Once peritoneal entry had been achieved, insufflation was started and pneumoperitoneum established at a pressure of 19mmHg. Two additional 15mm excel trocars were placed under direct visualiztion in the left lower quadrant and left upper quadrant.General inspection of the abdomen revealed the above noted findings.   The right ovary was able to be elevated out of the pelvis.  It was incised and drained noting dark brown fluid.  The ovarian cyst wall was peeled away from the ovarian cortex and stroma.  The base of the cyst cavity was cauterized using bipolar energy and 3g of Arista was applied.  Attention was turned to the left ovary which was grossly normal in appearance.  There was a small cyst which was incised containing blood clot consistent with a hemorrhagic cyst.  Chromopertubation was performed noting good spill from the left tube no spill from the right tube although the tube was grossly normal in appearance.    Pneumoperitoneum was evacuated.  The trocars were removed.   All trocar sites were then dressed with surgical skin glue.  The Zoomy was removed.  Sponge needle and instrument counts were correct time two.  The patient tolerated the procedure well and was taken to the recovery room in stable condition.

## 2019-02-27 NOTE — Anesthesia Procedure Notes (Signed)
Procedure Name: Intubation Date/Time: 02/27/2019 9:20 AM Performed by: Aline Brochure, CRNA Pre-anesthesia Checklist: Patient identified, Emergency Drugs available, Suction available and Patient being monitored Patient Re-evaluated:Patient Re-evaluated prior to induction Oxygen Delivery Method: Circle system utilized Preoxygenation: Pre-oxygenation with 100% oxygen Induction Type: IV induction Ventilation: Mask ventilation without difficulty Laryngoscope Size: McGraph and 3 Grade View: Grade II Tube type: Oral Tube size: 7.0 mm Number of attempts: 1 Airway Equipment and Method: Stylet and Video-laryngoscopy Placement Confirmation: ETT inserted through vocal cords under direct vision,  positive ETCO2 and breath sounds checked- equal and bilateral Secured at: 22 cm Tube secured with: Tape Dental Injury: Teeth and Oropharynx as per pre-operative assessment  Difficulty Due To: Difficult Airway- due to anterior larynx, Difficulty was anticipated and Difficult Airway- due to dentition Comments: Strange tissue anatomy around epiglotis

## 2019-02-27 NOTE — Anesthesia Preprocedure Evaluation (Signed)
Anesthesia Evaluation  Patient identified by MRN, date of birth, ID band Patient awake    Reviewed: Allergy & Precautions, NPO status , Patient's Chart, lab work & pertinent test results  History of Anesthesia Complications (+) Family history of anesthesia reactionNegative for: history of anesthetic complications  Airway Mallampati: II       Dental   Pulmonary asthma (last inhaler more than a year ago) , neg sleep apnea, neg COPD, Current Smoker and Patient abstained from smoking.,           Cardiovascular hypertension, Pt. on medications (-) Past MI and (-) CHF (-) dysrhythmias (-) Valvular Problems/Murmurs     Neuro/Psych neg Seizures Anxiety Depression    GI/Hepatic Neg liver ROS, GERD  ,  Endo/Other  neg diabetes  Renal/GU negative Renal ROS     Musculoskeletal   Abdominal   Peds  Hematology  (+) anemia ,   Anesthesia Other Findings   Reproductive/Obstetrics                             Anesthesia Physical Anesthesia Plan  ASA: II  Anesthesia Plan: General   Post-op Pain Management:    Induction: Intravenous  PONV Risk Score and Plan: 2 and Ondansetron and Dexamethasone  Airway Management Planned: Oral ETT  Additional Equipment:   Intra-op Plan:   Post-operative Plan:   Informed Consent: I have reviewed the patients History and Physical, chart, labs and discussed the procedure including the risks, benefits and alternatives for the proposed anesthesia with the patient or authorized representative who has indicated his/her understanding and acceptance.       Plan Discussed with:   Anesthesia Plan Comments:         Anesthesia Quick Evaluation

## 2019-02-27 NOTE — Discharge Instructions (Signed)

## 2019-02-27 NOTE — Transfer of Care (Signed)
Immediate Anesthesia Transfer of Care Note  Patient: Rachel Garrett  Procedure(s) Performed: LAPAROSCOPIC OVARIAN CYSTECTOMY (N/A ) CHROMOPERTUBATION (N/A )  Patient Location: PACU  Anesthesia Type:General  Level of Consciousness: awake  Airway & Oxygen Therapy: Patient connected to face mask oxygen  Post-op Assessment: Post -op Vital signs reviewed and stable  Post vital signs: stable  Last Vitals:  Vitals Value Taken Time  BP 141/94 02/27/19 1055  Temp 35.8 C 02/27/19 1055  Pulse 95 02/27/19 1101  Resp 12 02/27/19 1101  SpO2 100 % 02/27/19 1101  Vitals shown include unvalidated device data.  Last Pain:  Vitals:   02/27/19 0808  TempSrc: Temporal  PainSc: 0-No pain         Complications: No apparent anesthesia complications

## 2019-02-28 LAB — SURGICAL PATHOLOGY

## 2019-03-06 ENCOUNTER — Ambulatory Visit: Payer: Self-pay | Admitting: Obstetrics and Gynecology

## 2019-03-06 ENCOUNTER — Ambulatory Visit (INDEPENDENT_AMBULATORY_CARE_PROVIDER_SITE_OTHER): Payer: Self-pay | Admitting: Obstetrics and Gynecology

## 2019-03-06 ENCOUNTER — Encounter: Payer: Self-pay | Admitting: Obstetrics and Gynecology

## 2019-03-06 ENCOUNTER — Other Ambulatory Visit: Payer: Self-pay

## 2019-03-06 VITALS — BP 110/70 | Ht 67.0 in | Wt 177.0 lb

## 2019-03-06 DIAGNOSIS — N83291 Other ovarian cyst, right side: Secondary | ICD-10-CM

## 2019-03-06 DIAGNOSIS — G8929 Other chronic pain: Secondary | ICD-10-CM

## 2019-03-06 DIAGNOSIS — Z4889 Encounter for other specified surgical aftercare: Secondary | ICD-10-CM

## 2019-03-06 DIAGNOSIS — N809 Endometriosis, unspecified: Secondary | ICD-10-CM

## 2019-03-06 DIAGNOSIS — R102 Pelvic and perineal pain: Secondary | ICD-10-CM

## 2019-03-06 MED ORDER — ORILISSA 150 MG PO TABS: 1.0000 | ORAL_TABLET | Freq: Every day | ORAL | 5 refills | Status: DC

## 2019-03-06 NOTE — Progress Notes (Signed)
Postoperative Follow-up Patient presents post op from laprosopic right ovarian cystectomy 1weeks ago for endometrioma.  Subjective: Patient reports little improvement in her preop symptoms. Eating a regular diet without difficulty. Continues to report postoperative pain, had prolonged postoperative pain with prior surgeries.  Activity: normal activities of daily living.  Objective: Blood pressure 110/70, height 5\' 7"  (1.702 m), weight 177 lb (80.3 kg), last menstrual period 02/13/2019.  General: NAD Pulmonary: no increased work of breathing Abdomen: soft, non-tender, non-distended, incision(s) D/C/I Extremities: no edema Neurologic: normal gait    Admission on 02/27/2019, Discharged on 02/27/2019  Component Date Value Ref Range Status  . ABO/RH(D) 02/27/2019 O POS   Final  . Antibody Screen 02/27/2019 NEG   Final  . Sample Expiration 02/27/2019    Final                   Value:03/02/2019,2359 Performed at [facility] [address], [place], [place] [address]   . Preg Test, Ur 02/27/2019 NEGATIVE  NEGATIVE Final   Comment:        THE SENSITIVITY OF THIS METHODOLOGY IS >24 mIU/mL   . SURGICAL PATHOLOGY 02/27/2019    Final-Edited                   Value:SURGICAL PATHOLOGY CASE: [id number] PATIENT: Cleotis Lema Surgical Pathology Report     Specimen Submitted: A. Ovarian cyst wall, right  Clinical History: Bilateral ovarian cysts N83.29, endometriosis N80.9, pelvic pain R10.2; left ovary with small hemorrhagic cyst 1.5cm, right ovary grossly enlarged adherent to pelvic sidewall and containing two endometriomas,      DIAGNOSIS: A.  OVARIAN CYST WALL, RIGHT; CYSTECTOMY: - FRAGMENTS OF OVARIAN TISSUE WITH ENDOMETRIOSIS, COMPATIBLE WITH CLINICAL IMPRESSION OF ENDOMETRIOMA. - NEGATIVE FOR MALIGNANCY.  GROSS DESCRIPTION: A. Labeled: Ovarian cyst wall right Received: In formalin Tissue fragment(s): Multiple Size: Aggregate, 1.5 x 1.5 x  0.3 cm Description: Unoriented hemorrhagic soft tissue fragments Entirely submitted in 1 cassette.    Final Diagnosis performed by Quay Burow, MD.   Electronically signed 02/28/2019 1:56:28PM The electronic signature indicates that the named Attending Pathologist h                         as evaluated the specimen Technical component performed at [facility], [address] [place], [place] [address] Lab: [phone] Dir: Rush Farmer, MD, MMM  Professional component performed at [facility], [facility] [place], [place], [place] [address] Lab: [phone] Dir: Dellia Nims. Reuel Derby, MD     Assessment: 34 y.o. s/p laparoscopic right ovarian cystectomy stable  Plan: Patient has done well after surgery with no apparent complications.  I have discussed the post-operative course to date, and the expected progress moving forward.  The patient understands what complications to be concerned about.  I will see the patient in routine follow up, or sooner if needed.    Activity plan: No restriction.  Still having some pain out of proportion for laparoscopy.  However, based on patient's prior surgeries she has always had a more difficult time bouncing back.  Incision appear to be healing well.  She is still taking percocet.  I discussed some of her nausea and dizziness may be narcotic related.  As far as her endometriosis is concerned we will start her on Orilissa 150mg  po daily  Return in about 1 week (around 03/13/2019) for postop.    Malachy Mood, MD, [facility], [facility] 03/06/2019, 9:35 AM

## 2019-03-07 ENCOUNTER — Other Ambulatory Visit: Payer: Self-pay | Admitting: Obstetrics and Gynecology

## 2019-03-07 MED ORDER — OXYCODONE-ACETAMINOPHEN 5-325 MG PO TABS: 1.0000 | ORAL_TABLET | ORAL | 0 refills | Status: DC | PRN

## 2019-03-14 ENCOUNTER — Other Ambulatory Visit: Payer: Self-pay

## 2019-03-14 ENCOUNTER — Encounter: Payer: Self-pay | Admitting: Obstetrics and Gynecology

## 2019-03-14 ENCOUNTER — Ambulatory Visit (INDEPENDENT_AMBULATORY_CARE_PROVIDER_SITE_OTHER): Payer: 59 | Admitting: Obstetrics and Gynecology

## 2019-03-14 VITALS — BP 114/78 | Ht 67.0 in | Wt 179.0 lb

## 2019-03-14 DIAGNOSIS — N83201 Unspecified ovarian cyst, right side: Secondary | ICD-10-CM

## 2019-03-14 DIAGNOSIS — N83202 Unspecified ovarian cyst, left side: Secondary | ICD-10-CM

## 2019-03-14 DIAGNOSIS — N809 Endometriosis, unspecified: Secondary | ICD-10-CM

## 2019-03-14 DIAGNOSIS — Z4889 Encounter for other specified surgical aftercare: Secondary | ICD-10-CM

## 2019-03-14 NOTE — Progress Notes (Signed)
      Postoperative Follow-up Patient presents post op from laparoscopic right ovarian cystectomy 2weeks ago for adnexal mass.  Subjective: Patient reports marked improvement in her preop symptoms. Eating a regular diet without difficulty. Pain is controlled without any medications.  Activity: normal activities of daily living.  Objective: Blood pressure 114/78, height 5\' 7"  (1.702 m), weight 179 lb (81.2 kg), last menstrual period 02/13/2019.  General: NAD Pulmonary: no increased work of breathing Abdomen: soft, non-tender, non-distended, incision(s) D/C/I Extremities: no edema Neurologic: normal gait    Admission on 02/27/2019, Discharged on 02/27/2019  Component Date Value Ref Range Status  . ABO/RH(D) 02/27/2019 O POS   Final  . Antibody Screen 02/27/2019 NEG   Final  . Sample Expiration 02/27/2019    Final                   Value:03/02/2019,2359 Performed at [facility], [address] [place], [place] [address]   . Preg Test, Ur 02/27/2019 NEGATIVE  NEGATIVE Final   Comment:        THE SENSITIVITY OF THIS METHODOLOGY IS >24 mIU/mL   . SURGICAL PATHOLOGY 02/27/2019    Final-Edited                   Value:SURGICAL PATHOLOGY CASE: [id number] PATIENT: Cleotis Lema Surgical Pathology Report     Specimen Submitted: A. Ovarian cyst wall, right  Clinical History: Bilateral ovarian cysts N83.29, endometriosis N80.9, pelvic pain R10.2; left ovary with small hemorrhagic cyst 1.5cm, right ovary grossly enlarged adherent to pelvic sidewall and containing two endometriomas,      DIAGNOSIS: A.  OVARIAN CYST WALL, RIGHT; CYSTECTOMY: - FRAGMENTS OF OVARIAN TISSUE WITH ENDOMETRIOSIS, COMPATIBLE WITH CLINICAL IMPRESSION OF ENDOMETRIOMA. - NEGATIVE FOR MALIGNANCY.  GROSS DESCRIPTION: A. Labeled: Ovarian cyst wall right Received: In formalin Tissue fragment(s): Multiple Size: Aggregate, 1.5 x 1.5 x 0.3 cm Description: Unoriented hemorrhagic soft  tissue fragments Entirely submitted in 1 cassette.    Final Diagnosis performed by Quay Burow, MD.   Electronically signed 02/28/2019 1:56:28PM The electronic signature indicates that the named Attending Pathologist h                         as evaluated the specimen Technical component performed at [facility], [address] [place], [place] [address] Lab: [phone] Dir: Rush Farmer, MD, MMM  Professional component performed at [facility], [place], [place], [place] [address] Lab: [phone] Dir: Dellia Nims. Reuel Derby, MD     Assessment: 34 y.o. s/p laparoscopic right ovarian cystectomy stable  Plan: Patient has done well after surgery with no apparent complications.  I have discussed the post-operative course to date, and the expected progress moving forward.  The patient understands what complications to be concerned about.  I will see the patient in routine follow up, or sooner if needed.    Activity plan: No restriction.  Still waiting on insurance to kick in currently taking norethindrone instead of orilissa   Return in about 4 weeks (around 04/11/2019) for 6 week postop.    Malachy Mood, MD, [facility], [facility] 03/14/2019, 10:03 AM

## 2019-04-02 ENCOUNTER — Telehealth: Payer: Self-pay

## 2019-04-02 NOTE — Telephone Encounter (Signed)
FMLA/DISABILITY form for [employer] filled out, signature obtained and given to KT for processing.

## 2019-04-04 ENCOUNTER — Telehealth: Payer: Self-pay | Admitting: Nurse Practitioner

## 2019-04-04 DIAGNOSIS — Z760 Encounter for issue of repeat prescription: Secondary | ICD-10-CM

## 2019-04-04 DIAGNOSIS — I10 Essential (primary) hypertension: Secondary | ICD-10-CM

## 2019-04-04 MED ORDER — HYDROCHLOROTHIAZIDE 12.5 MG PO TABS: 12.5000 mg | ORAL_TABLET | Freq: Every day | ORAL | 0 refills | Status: DC

## 2019-04-04 NOTE — Progress Notes (Signed)
I contacted patient by phone and she was not c/o acne. She said there was no choice for refill of meds, so she just picked any questionnaire.she does not have acne. She maoinly just needed refilll of her hydrocorthiazide 12.5mg . reviewing chart she has not had lab work for hypertension since 2018, other then potassium level. I informed patient that can only do 30 day refill , and that she will need to see her PCP  In oreder to get Kidney function checked.    E-Visits are not used to request refills.  After reviewing your records, I can verify that you may be running out of a long term medication before your next scheduled appointment.  Based on this information,  I can refill your (free text) on a one time basis.  Please contact your doctor as soon as possible to manage your prescription.'    5-10 minutes spent reviewing and documenting in chart.  Mary-Margaret Hassell Done, FNP

## 2019-04-07 ENCOUNTER — Other Ambulatory Visit: Payer: Self-pay | Admitting: Obstetrics and Gynecology

## 2019-04-07 MED ORDER — NORETHINDRONE ACETATE 5 MG PO TABS: 5.0000 mg | ORAL_TABLET | Freq: Every day | ORAL | 11 refills | Status: DC

## 2019-04-08 NOTE — Telephone Encounter (Signed)
PA for Rachel Garrett has been started through [facility] (CoverMyMeds)> I will update pt on status as I am updated.

## 2019-04-11 ENCOUNTER — Ambulatory Visit: Payer: Self-pay | Admitting: Obstetrics and Gynecology

## 2019-04-22 ENCOUNTER — Other Ambulatory Visit: Payer: Self-pay

## 2019-04-22 ENCOUNTER — Telehealth: Payer: Self-pay

## 2019-04-22 MED ORDER — ORILISSA 150 MG PO TABS: 1.0000 | ORAL_TABLET | Freq: Every day | ORAL | 5 refills | Status: DC

## 2019-04-22 NOTE — Telephone Encounter (Signed)
PT aware via voicemail of Freida Busman being approved. I will resend it to the pharmacy just as precaution of original Rx being called in awhile back and may no longer be at pharmacy

## 2019-05-14 DIAGNOSIS — N809 Endometriosis, unspecified: Secondary | ICD-10-CM | POA: Diagnosis not present

## 2019-05-15 DIAGNOSIS — M542 Cervicalgia: Secondary | ICD-10-CM | POA: Diagnosis not present

## 2019-05-15 DIAGNOSIS — M545 Low back pain: Secondary | ICD-10-CM | POA: Diagnosis not present

## 2019-05-19 DIAGNOSIS — R1084 Generalized abdominal pain: Secondary | ICD-10-CM | POA: Diagnosis not present

## 2019-05-19 DIAGNOSIS — Z0000 Encounter for general adult medical examination without abnormal findings: Secondary | ICD-10-CM | POA: Diagnosis not present

## 2019-05-28 ENCOUNTER — Other Ambulatory Visit: Payer: Self-pay | Admitting: Family Medicine

## 2019-05-28 DIAGNOSIS — R1084 Generalized abdominal pain: Secondary | ICD-10-CM

## 2019-05-30 ENCOUNTER — Other Ambulatory Visit: Payer: Self-pay | Admitting: Student

## 2019-05-30 DIAGNOSIS — M5416 Radiculopathy, lumbar region: Secondary | ICD-10-CM

## 2019-06-02 ENCOUNTER — Telehealth: Payer: 59 | Admitting: Family

## 2019-06-02 DIAGNOSIS — L0291 Cutaneous abscess, unspecified: Secondary | ICD-10-CM

## 2019-06-02 MED ORDER — SULFAMETHOXAZOLE-TRIMETHOPRIM 800-160 MG PO TABS: 1.0000 | ORAL_TABLET | Freq: Two times a day (BID) | ORAL | 0 refills | Status: DC

## 2019-06-02 NOTE — Progress Notes (Signed)
E Visit for Cellulitis ° °We are sorry that you are not feeling well. Here is how we plan to help! ° °Based on what you shared with me it looks like you have cellulitis.  Cellulitis looks like areas of skin redness, swelling, and warmth; it develops as a result of bacteria entering under the skin. Little red spots and/or bleeding can be seen in skin, and tiny surface sacs containing fluid can occur. Fever can be present. Cellulitis is almost always on one side of a body, and the lower limbs are the most common site of involvement.  ° °I have prescribed:  Bactrim DS 1 tablet by mouth twice a day for 7 days. ° °HOME CARE: ° °Take your medications as ordered and take all of them, even if the skin irritation appears to be healing.  ° °GET HELP RIGHT AWAY IF: ° °Symptoms that don't begin to go away within 48 hours. °Severe redness persists or worsens °If the area turns color, spreads or swells. °If it blisters and opens, develops yellow-brown crust or bleeds. °You develop a fever or chills. °If the pain increases or becomes unbearable.  °Are unable to keep fluids and food down. ° °MAKE SURE YOU  ° °Understand these instructions. °Will watch your condition. °Will get help right away if you are not doing well or get worse. ° °Thank you for choosing an e-visit. ° °Your e-visit answers were reviewed by a board certified advanced clinical practitioner to complete your personal care plan. Depending upon the condition, your plan could have included both over the counter or prescription medications. ° °Please review your pharmacy choice. Make sure the pharmacy is open so you can pick up prescription now. If there is a problem, you may contact your provider through MyChart messaging and have the prescription routed to another pharmacy.  Your safety is important to us. If you have drug allergies check your prescription carefully.  ° °For the next 24 hours you can use MyChart to ask questions about today's visit, request a  non-urgent call back, or ask for a work or school excuse. °You will get an email in the next two days asking about your experience. I hope that your e-visit has been valuable and will speed your recovery. ° °Approximately 5 minutes was spent documenting and reviewing patient's chart.  ° ° °

## 2019-06-09 ENCOUNTER — Other Ambulatory Visit: Payer: Self-pay | Admitting: Obstetrics and Gynecology

## 2019-06-09 DIAGNOSIS — R102 Pelvic and perineal pain: Secondary | ICD-10-CM

## 2019-06-09 DIAGNOSIS — G8929 Other chronic pain: Secondary | ICD-10-CM

## 2019-06-09 DIAGNOSIS — N809 Endometriosis, unspecified: Secondary | ICD-10-CM

## 2019-06-09 NOTE — Telephone Encounter (Signed)
Order is in sometime in the next 2-4 weeks

## 2019-06-10 ENCOUNTER — Ambulatory Visit
Admission: RE | Admit: 2019-06-10 | Discharge: 2019-06-10 | Disposition: A | Discharge status: Home / Self Care | Payer: 59 | Source: Ambulatory Visit | Attending: Family Medicine | Admitting: Family Medicine

## 2019-06-10 ENCOUNTER — Other Ambulatory Visit: Payer: Self-pay

## 2019-06-10 DIAGNOSIS — R1084 Generalized abdominal pain: Secondary | ICD-10-CM | POA: Diagnosis not present

## 2019-06-10 LAB — POCT I-STAT CREATININE: Creatinine, Ser: 1 mg/dL (ref 0.44–1.00)

## 2019-06-11 ENCOUNTER — Ambulatory Visit: Payer: 59 | Attending: Student

## 2019-06-11 ENCOUNTER — Ambulatory Visit
Admission: RE | Admit: 2019-06-11 | Discharge: 2019-06-11 | Disposition: A | Discharge status: Home / Self Care | Payer: 59 | Source: Ambulatory Visit | Attending: Student | Admitting: Student

## 2019-06-11 ENCOUNTER — Other Ambulatory Visit: Payer: Self-pay | Admitting: Obstetrics and Gynecology

## 2019-06-11 ENCOUNTER — Other Ambulatory Visit: Payer: Self-pay

## 2019-06-11 DIAGNOSIS — M545 Low back pain: Secondary | ICD-10-CM | POA: Diagnosis not present

## 2019-06-11 DIAGNOSIS — K429 Umbilical hernia without obstruction or gangrene: Secondary | ICD-10-CM | POA: Diagnosis not present

## 2019-06-11 DIAGNOSIS — M5416 Radiculopathy, lumbar region: Secondary | ICD-10-CM | POA: Insufficient documentation

## 2019-06-11 MED ORDER — IOHEXOL 300 MG/ML  SOLN
100.0000 mL | Freq: Once | INTRAMUSCULAR | Status: AC | PRN
  Administered 2019-06-11: 100 mL via INTRAVENOUS

## 2019-06-11 MED ORDER — ONDANSETRON 4 MG PO TBDP: 4.0000 mg | ORAL_TABLET | Freq: Four times a day (QID) | ORAL | 0 refills | Status: DC | PRN

## 2019-06-17 ENCOUNTER — Ambulatory Visit: Payer: 59

## 2019-06-19 ENCOUNTER — Ambulatory Visit: Payer: 59

## 2019-06-20 ENCOUNTER — Ambulatory Visit: Payer: 59

## 2019-06-20 ENCOUNTER — Ambulatory Visit (INDEPENDENT_AMBULATORY_CARE_PROVIDER_SITE_OTHER): Payer: 59 | Admitting: Obstetrics and Gynecology

## 2019-06-20 ENCOUNTER — Encounter: Payer: Self-pay | Admitting: Obstetrics and Gynecology

## 2019-06-20 ENCOUNTER — Other Ambulatory Visit: Payer: Self-pay

## 2019-06-20 VITALS — BP 112/88 | HR 91 | Wt 175.0 lb

## 2019-06-20 DIAGNOSIS — N809 Endometriosis, unspecified: Secondary | ICD-10-CM | POA: Diagnosis not present

## 2019-06-20 DIAGNOSIS — R102 Pelvic and perineal pain: Secondary | ICD-10-CM

## 2019-06-20 DIAGNOSIS — M79601 Pain in right arm: Secondary | ICD-10-CM | POA: Diagnosis not present

## 2019-06-20 DIAGNOSIS — M79602 Pain in left arm: Secondary | ICD-10-CM | POA: Diagnosis not present

## 2019-06-20 DIAGNOSIS — G8929 Other chronic pain: Secondary | ICD-10-CM | POA: Diagnosis not present

## 2019-06-20 NOTE — Progress Notes (Signed)
Gynecology Ultrasound Follow Up  Chief Complaint:  Chief Complaint  Patient presents with   Follow-up    GYN ultrasound     History of Present Illness: Patient is a 34 y.o. female who presents today for ultrasound evaluation of pelvic pain.  Ultrasound demonstrates the following findgins Adnexa: no masses seen  Uterus: Non-enlarged, 3 small ~1-1.5cm intrmural fibroid, with endometrial stripe normal without focal abnormalities Additional: no free fluid  Review of Systems: Review of Systems  Constitutional: Negative.   Gastrointestinal: Negative.   Genitourinary: Negative.     Past Medical History:  Past Medical History:  Diagnosis Date   Anemia    Anxiety    Asthma    well controlled   Concussion    Depression    Family history of adverse reaction to anesthesia    sister n/v, paternalcousin had trouble  waking  up   GERD (gastroesophageal reflux disease)    occ   Hypertension    Migraines    migraines    Past Surgical History:  Past Surgical History:  Procedure Laterality Date   CHROMOPERTUBATION N/A 02/27/2019   Procedure: CHROMOPERTUBATION;  Surgeon: Malachy Mood, MD;  Location: [facility];  Service: Gynecology;  Laterality: N/A;   HYSTEROSCOPY N/A 09/04/2017   Procedure: HYSTEROSCOPY;  Surgeon: Malachy Mood, MD;  Location: [facility];  Service: Gynecology;  Laterality: N/A;   LAPAROSCOPIC OVARIAN CYSTECTOMY N/A 02/27/2019   Procedure: LAPAROSCOPIC OVARIAN CYSTECTOMY;  Surgeon: Malachy Mood, MD;  Location: [facility];  Service: Gynecology;  Laterality: N/A;   LAPAROSCOPY N/A 09/04/2017   Procedure: LAPAROSCOPY DIAGNOSTIC;  Surgeon: Malachy Mood, MD;  Location: [facility];  Service: Gynecology;  Laterality: N/A;   LAPAROTOMY N/A 11/02/2016   Procedure: LAPAROTOMY;  Surgeon: Malachy Mood, MD;  Location: [facility];  Service: Gynecology;  Laterality: N/A;   LIVER BIOPSY  at birth   MYOMECTOMY N/A 11/02/2016   Procedure: MYOMECTOMY;   Surgeon: Malachy Mood, MD;  Location: [facility];  Service: Gynecology;  Laterality: N/A;   OVARIAN CYST REMOVAL Right 11/02/2016   Procedure: OVARIAN CYSTECTOMY;  Surgeon: Malachy Mood, MD;  Location: [facility];  Service: Gynecology;  Laterality: Right;   TONSILLECTOMY     TONSILLECTOMY      Gynecologic History:  No LMP recorded. (Menstrual status: Oral contraceptives).  Family History:  Family History  Problem Relation Age of Onset   Breast cancer Maternal Aunt        Great Aunt   Breast cancer Paternal Aunt 71   Cervical cancer Maternal Aunt    Breast cancer Paternal 78        Twin half sisters of dad    Social History:  Social History   Socioeconomic History   Marital status: Single    Spouse name: Not on file   Number of children: Not on file   Years of education: Not on file   Highest education level: Not on file  Occupational History   Not on file  Tobacco Use   Smoking status: Current Every Day Smoker    Packs/day: 0.25    Years: 10.00    Pack years: 2.50    Types: Cigarettes   Smokeless tobacco: Never Used  Substance and Sexual Activity   Alcohol use: Yes    Alcohol/week: 2.0 standard drinks    Types: 2 Cans of beer per week    Comment: weekend   Drug use: Not Currently    Comment: PT DENIES BUT HAS A + UDS IN 2017  FOR MARIJUANA   Sexual activity: Yes    Birth control/protection: None  Other Topics Concern   Not on file  Social History Narrative   Not on file   Social Determinants of Health   Financial Resource Strain:    Difficulty of Paying Living Expenses:   Food Insecurity:    Worried About Charity fundraiser in the Last Year:    Arboriculturist in the Last Year:   Transportation Needs:    Film/video editor (Medical):    Lack of Transportation (Non-Medical):   Physical Activity:    Days of Exercise per Week:    Minutes of Exercise per Session:   Stress:    Feeling of Stress :   Social  Connections:    Frequency of Communication with Friends and Family:    Frequency of Social Gatherings with Friends and Family:    Attends Religious Services:    Active Member of Clubs or Organizations:    Attends Archivist Meetings:    Marital Status:   Intimate Partner Violence:    Fear of Current or Ex-Partner:    Emotionally Abused:    Physically Abused:    Sexually Abused:     Allergies:  Allergies  Allergen Reactions   Bee Venom Swelling   Benadryl [Diphenhydramine Hcl (Sleep)] Swelling   Chocolate     migraines   Estrogens     Increases chances of having stroke   Latex Itching and Swelling   Ultram [Tramadol Hcl] Itching    Medications: Prior to Admission medications   Medication Sig Start Date End Date Taking? Authorizing Provider  Elagolix Sodium (ORILISSA) 150 MG TABS Take 1 tablet by mouth daily. 04/22/19  Yes Malachy Mood, MD  fluticasone (FLONASE) 50 MCG/ACT nasal spray Place into both nostrils daily.   Yes [provider]  hydrochlorothiazide (HYDRODIURIL) 12.5 MG tablet Take 1 tablet (12.5 mg total) by mouth daily. 04/04/19  Yes Hassell Done, Mary-Margaret, FNP  ondansetron (ZOFRAN ODT) 4 MG disintegrating tablet Take 1 tablet (4 mg total) by mouth every 6 (six) hours as needed for nausea. 06/11/19  Yes Malachy Mood, MD  rizatriptan (MAXALT) 10 MG tablet Take 10 mg by mouth every 2 (two) hours as needed for migraine.  04/06/17  Yes [provider]  topiramate (TOPAMAX) 100 MG tablet Take 1 tablet (100 mg total) by mouth every morning. 01/05/19  Yes Wieters, Hallie C, PA-C  valACYclovir (VALTREX) 1000 MG tablet TAKE 1 TABLET BY MOUTH EVERY DAY Patient taking differently: Take 1,000 mg by mouth daily.  10/15/18  Yes Malachy Mood, MD  celecoxib (CELEBREX) 100 MG capsule Take 100 mg by mouth 2 (two) times daily. 06/17/19   [provider]  methocarbamol (ROBAXIN) 500 MG tablet  06/17/19   [provider]     Physical Exam Vitals: Blood pressure 112/88, pulse 91, weight 175 lb (79.4 kg).  General: NAD HEENT: normocephalic, anicteric Pulmonary: No increased work of breathing Extremities: no edema, erythema, or tenderness Neurologic: Grossly intact, normal gait Psychiatric: mood appropriate, affect full  MR LUMBAR SPINE WO CONTRAST  Result Date: 06/11/2019 CLINICAL DATA:  Initial evaluation for low back pain with right lower extremity pain for 2 months. EXAM: MRI LUMBAR SPINE WITHOUT CONTRAST TECHNIQUE: Multiplanar, multisequence MR imaging of the lumbar spine was performed. No intravenous contrast was administered. COMPARISON:  Prior radiograph from 07/21/2013. FINDINGS: Segmentation: Standard. Lowest well-formed disc space labeled the L5-S1 level. Alignment: Physiologic with preservation of the normal lumbar  lordosis. No listhesis. Vertebrae: Vertebral body height maintained without evidence for acute or chronic fracture. Bone marrow signal intensity within normal limits. No discrete or worrisome osseous lesions. No abnormal marrow edema. Conus medullaris and cauda equina: Conus extends to the L1 level. Conus and cauda equina appear normal. Paraspinal and other soft tissues: Paraspinous soft tissues within normal limits. Visualized visceral structures are normal. Disc levels: L1-2:  Unremarkable. L2-3:  Unremarkable. L3-4:  Unremarkable. L4-5: Small right foraminal disc protrusion contacts the transiting right L4 nerve root in the right neural foramen (series 9, image 27). Superimposed mild facet and ligament flavum hypertrophy. No significant spinal stenosis. Mild to moderate right L4 foraminal narrowing. No significant left foraminal stenosis. L5-S1: Negative interspace. Mild facet hypertrophy. Mild epidural lipomatosis. No canal or foraminal stenosis. IMPRESSION: 1. Small right foraminal disc protrusion at L4-5, contacting the transiting right L4 nerve root. 2. Mild facet hypertrophy at L4-5 and  L5-S1. Electronically Signed   By: Jeannine Boga M.D.   On: 06/11/2019 21:35   US Transvaginal Non-OB  Result Date: 06/20/2019 Patient Name: Rachel Garrett DOB: 1985-11-17 MRN: [id number] ULTRASOUND REPORT Location: [facility] Date of Service: 06/20/2019 Indications:Pelvic Pain Findings: The uterus is anteverted and measures 8.3 x 4.7 x 4.0 cm. Echo texture is heterogenous with evidence of focal masses. Within the uterus are multiple suspected fibroids measuring: Fibroid 1:13.0 x 11.4 x 12.2 mm intramural anterior right Fibroid 2:16.9 x 14.7 x 12.5 mm. Intramural left Fibroid 3: 8.2 x 5.1 x 6.1 mm intramural posterior Fibroid 4: 10.0 x 6.6 x 12.2 mm subserosal fundal The Endometrium measures 7.9 mm. Right Ovary measures 3.2 x 2.3 x 2.8 cm.  There is a complex,  involuting cyst in the right ovary. It measures 15.9 x 12.0 x 15.7 mm. No blood flow is seen within this cyst. Left Ovary measures 4.0 x 3.5 x 2.4 cm. It is normal in appearance. There is a corpus luteal cyst in the left ovary measuring 19.3 x 16.0 x 12.7 mm. Survey of the adnexa demonstrates no adnexal masses. There is scant free fluid in the cul de sac. Impression: 1. There are at least 4 uterine fibroids. 2. Normal appearing endometrium. 3. There is an involuting cyst in the right ovary and a corpus luteal cyst in the left ovary. Recommendations: 1.Clinical correlation with the patient's History and Physical Exam. Gweneth Dimitri, RT Images reviewed.  Several small uterine fibroid all around 1cm, no evidence of recurrence of endometrioma, Malachy Mood, MD, [place], [facility] 06/20/2019, 3:12 PM   CT ABDOMEN PELVIS W CONTRAST  Result Date: 06/11/2019 CLINICAL DATA:  LEFT lower quadrant pain for 3 months EXAM: CT ABDOMEN AND PELVIS WITH CONTRAST TECHNIQUE: Multidetector CT imaging of the abdomen and pelvis was performed using the standard protocol following bolus administration of intravenous contrast.  Sagittal and coronal MPR images reconstructed from axial data set. CONTRAST:  180mL OMNIPAQUE IOHEXOL 300 MG/ML SOLN IV. Dilute oral contrast. COMPARISON:  None FINDINGS: Lower chest: Lung bases clear Hepatobiliary: Mass identified at RIGHT lobe liver 2.6 x 2.6 x 3.8 cm, demonstrating uniform peripheral hypervascularity on portal venous phase imaging and a central area of low attenuation. No additional hepatic lesions identified. Gallbladder unremarkable. Pancreas: Normal appearance Spleen: Normal appearance Adrenals/Urinary Tract: Adrenal glands, kidneys, ureters, and bladder normal appearance Stomach/Bowel: Normal appendix. Stomach and bowel loops normal appearance Vascular/Lymphatic: Vascular structures patent. Aorta normal caliber. No adenopathy. Reproductive: Several small probable subserosal leiomyomata of the uterus. Unremarkable adnexa.  Other: No free air or free fluid. Tiny umbilical hernia containing fat. No inflammatory process. Musculoskeletal: Unremarkable IMPRESSION: 2.6 x 2.6 x 3.8 cm diameter enhancing lesion at RIGHT lobe liver, with peripheral hypervascularity on portal venous phase imaging and a central area of low-attenuation. Differential diagnosis would include focal nodular hyperplasia, atypical hemangioma, hepatic adenoma. Characterization by MR imaging with and without contrast recommended. Tiny umbilical hernia containing fat. No other significant intra-abdominal or intrapelvic findings. These results will be called to the ordering clinician or representative by the Radiologist Assistant, and communication documented in the PACS or [employer]. Electronically Signed   By: Lavonia Dana M.D.   On: 06/11/2019 15:37     Assessment: 34 y.o. G2P0020 follow up pelvic pain, endometriosis Plan: Problem List Items Addressed This Visit      Other   Endometriosis determined by laparoscopy - Primary      1) No pain currently doing well on [place], having hepatic lesion currently worked  up awaiting visit with GI  2) A total of 15 minutes were spent in face-to-face contact with the patient during this encounter with over half of that time devoted to counseling and coordination of care.  3) Return in about 1 year (around 06/19/2020) for annual.    Malachy Mood, MD, [place], [facility] 06/20/2019, 3:15 PM

## 2019-06-30 ENCOUNTER — Other Ambulatory Visit: Payer: Self-pay | Admitting: Nurse Practitioner

## 2019-06-30 DIAGNOSIS — M79602 Pain in left arm: Secondary | ICD-10-CM

## 2019-06-30 DIAGNOSIS — M79601 Pain in right arm: Secondary | ICD-10-CM

## 2019-07-01 ENCOUNTER — Telehealth: Payer: Self-pay | Admitting: Nurse Practitioner

## 2019-07-01 NOTE — Telephone Encounter (Signed)
07/01/19~LM on VM. MF 

## 2019-07-15 ENCOUNTER — Other Ambulatory Visit: Payer: Self-pay

## 2019-07-15 ENCOUNTER — Ambulatory Visit
Admission: RE | Admit: 2019-07-15 | Discharge: 2019-07-15 | Disposition: A | Discharge status: Home / Self Care | Payer: 59 | Source: Ambulatory Visit | Attending: Nurse Practitioner | Admitting: Nurse Practitioner

## 2019-07-15 DIAGNOSIS — M79602 Pain in left arm: Secondary | ICD-10-CM | POA: Insufficient documentation

## 2019-07-15 DIAGNOSIS — M79601 Pain in right arm: Secondary | ICD-10-CM | POA: Insufficient documentation

## 2019-07-15 DIAGNOSIS — M5441 Lumbago with sciatica, right side: Secondary | ICD-10-CM | POA: Insufficient documentation

## 2019-07-15 DIAGNOSIS — M50222 Other cervical disc displacement at C5-C6 level: Secondary | ICD-10-CM | POA: Diagnosis not present

## 2019-07-15 DIAGNOSIS — M5116 Intervertebral disc disorders with radiculopathy, lumbar region: Secondary | ICD-10-CM | POA: Insufficient documentation

## 2019-07-23 ENCOUNTER — Other Ambulatory Visit: Payer: Self-pay | Admitting: Family Medicine

## 2019-07-25 ENCOUNTER — Encounter: Payer: Self-pay | Admitting: Family Medicine

## 2019-07-30 DIAGNOSIS — M5441 Lumbago with sciatica, right side: Secondary | ICD-10-CM | POA: Diagnosis not present

## 2019-08-06 DIAGNOSIS — R200 Anesthesia of skin: Secondary | ICD-10-CM | POA: Diagnosis not present

## 2019-08-06 DIAGNOSIS — R202 Paresthesia of skin: Secondary | ICD-10-CM | POA: Diagnosis not present

## 2019-08-07 ENCOUNTER — Other Ambulatory Visit: Payer: Self-pay

## 2019-08-07 ENCOUNTER — Ambulatory Visit: Payer: 59 | Attending: Student

## 2019-08-07 DIAGNOSIS — M542 Cervicalgia: Secondary | ICD-10-CM | POA: Insufficient documentation

## 2019-08-07 DIAGNOSIS — G8929 Other chronic pain: Secondary | ICD-10-CM | POA: Insufficient documentation

## 2019-08-07 DIAGNOSIS — R262 Difficulty in walking, not elsewhere classified: Secondary | ICD-10-CM | POA: Diagnosis present

## 2019-08-07 DIAGNOSIS — M545 Low back pain, unspecified: Secondary | ICD-10-CM

## 2019-08-07 DIAGNOSIS — M5417 Radiculopathy, lumbosacral region: Secondary | ICD-10-CM | POA: Insufficient documentation

## 2019-08-07 DIAGNOSIS — M6281 Muscle weakness (generalized): Secondary | ICD-10-CM | POA: Diagnosis present

## 2019-08-07 NOTE — Therapy (Signed)
[facility] [address], [place], [address] Phone: [phone]   Fax:  [phone]  Physical Therapy Evaluation  Patient Details  Name: Rachel Garrett MRN: [id number] Date of Birth: [birth year] Referring Provider (PT): Marin Olp, [place]   Encounter Date: 08/07/2019   PT End of Session - 08/07/19 1723    Visit Number 1    Number of Visits 17    Date for PT Re-Evaluation 10/02/19    PT Start Time 8182    PT Stop Time 1853    PT Time Calculation (min) 88 min    Activity Tolerance Patient tolerated treatment well    Behavior During Therapy [facility] for tasks assessed/performed           Past Medical History:  Diagnosis Date  . Anemia   . Anxiety   . Asthma    well controlled  . Concussion   . Depression   . Family history of adverse reaction to anesthesia    sister n/v, paternalcousin had trouble  waking  up  . GERD (gastroesophageal reflux disease)    occ  . Hypertension   . Migraines    migraines    Past Surgical History:  Procedure Laterality Date  . CHROMOPERTUBATION N/A 02/27/2019   Procedure: CHROMOPERTUBATION;  Surgeon: Malachy Mood, MD;  Location: [facility];  Service: Gynecology;  Laterality: N/A;  . HYSTEROSCOPY N/A 09/04/2017   Procedure: HYSTEROSCOPY;  Surgeon: Malachy Mood, MD;  Location: [facility];  Service: Gynecology;  Laterality: N/A;  . LAPAROSCOPIC OVARIAN CYSTECTOMY N/A 02/27/2019   Procedure: LAPAROSCOPIC OVARIAN CYSTECTOMY;  Surgeon: Malachy Mood, MD;  Location: [facility];  Service: Gynecology;  Laterality: N/A;  . LAPAROSCOPY N/A 09/04/2017   Procedure: LAPAROSCOPY DIAGNOSTIC;  Surgeon: Malachy Mood, MD;  Location: [facility];  Service: Gynecology;  Laterality: N/A;  . LAPAROTOMY N/A 11/02/2016   Procedure: LAPAROTOMY;  Surgeon: Malachy Mood, MD;  Location: [facility];  Service: Gynecology;  Laterality: N/A;  . 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 MYOMECTOMY N/A  11/02/2016   Procedure: MYOMECTOMY;  Surgeon: Malachy Mood, MD;  Location: [facility];  Service: Gynecology;  Laterality: N/A;  . OVARIAN CYST REMOVAL Right 11/02/2016   Procedure: OVARIAN CYSTECTOMY;  Surgeon: Malachy Mood, MD;  Location: [facility];  Service: Gynecology;  Laterality: Right;  . TONSILLECTOMY    . TONSILLECTOMY      There were no vitals filed for this visit.    Subjective Assessment - 08/07/19 1727    Subjective Low back 0/10 currently (pt took oxycodone 1 hour before PT), 10/10 at worst for the past month; R LE 6/10 currently, 100/10 at most for the past month.   L LE: 2/10 currently, 6/10 at most for the past month.  Neck: 5/10 at most for the past 3 months (lower cervical spine).    Pertinent History Neck and back pain. Back pain is currently bothering her more. Has L4/5 ruptured disc and has shooting pain R LE (L5 dermatome). R LE goes numb. ALso has L L5 dermatome symptoms, not past the knee. Pain begain April 2021. 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 Pt had to tilt the patient back to wheel pt through the door threshold. Pt was in a lot of pain that night.  LE bothers her more than her back. R LE feels like is swelling from the inside but has never swelled.  Both UE would lock up  but not knows why. Pt UE would freeze if she tries to open a door knob.  B UE locking has been happening for 2 years, sudden onset. Pt was pressing down on a pack and play and her R arm went numb suddenly followed by an ache. Felt like a "charlie horse" but in her arm. Had and imaging for her neck which showed a ruptured disc.  Last time her arms locked up was in January 2021 L UE with pain around her arm pit (around teres major and subscapularis, serratus anterior area).    Patient Stated Goals Get rid of the pain, be able to stand longer, be able to stand and wash dishes at home.    Currently in Pain? Yes    Pain Score 6     Pain Orientation Right      Pain Descriptors / Indicators Shooting;Aching;Numbness    Pain Type Chronic pain    Pain Radiating Towards R LE L5 dermatome to her foot, L LE L 5 dermatome to her thigh.    Pain Onset More than a month ago    Pain Frequency Constant    Aggravating Factors  Placing pressure on R LE; standing too long (about 10 minutes), sitting for too long (about 10 minutes), bending over and picking up something from the floor, then standing back up; driving, pushing the gas pedal and break pedal.    Pain Relieving Factors no pressure on R LE, laying on her back., oxycodone              [facility] PT Assessment - 08/07/19 1743      Assessment   Medical Diagnosis Neck pain, low back pain unspecified whether sciatica is present    Referring Provider (PT) Marin Olp, PA    Onset Date/Surgical Date 05/15/19   Date PT referral signed   Prior Therapy Pt had prior PT and chiropractic treatment for her low back years ago with positive results      Precautions   Precaution Comments no known precautions      Restrictions   Other Position/Activity Restrictions no known restrictions      Posture/Postural Control   Posture Comments protracted neck, B protracted shoulders, R shoulder higher, slight R lateral shift and R trunk rotation, movement preference around L 4/5 area, R hip in ER      AROM   Lumbar Flexion limited    Lumbar Extension limited with low back pain    increased R LE numbness   Lumbar - Right Side Bend Very limited with increased R proximal thight and R hip symptoms    Lumbar - Left Side Bend WFL    Lumbar - Right Rotation WFL with sharp R posterior hip pain    Lumbar - Left Rotation [facility]      Strength   Right Hip Flexion 4/5    Right Hip Extension 3/5   with posterior hip pain   Left Hip Flexion 4+/5    Left Hip Extension 4-/5    Right Knee Flexion 4+/5    Right Knee Extension 5/5    Left Knee Flexion 4/5    Left Knee Extension 5/5      Palpation   Palpation comment TTP R L5/S1  area      Ambulation/Gait   Gait Comments antalgic                      Objective measurements completed on examination: See above findings.  Latex band allergies Blood pressure controlled per pt    Antalgic, decreased stance L LE   Increased numbness with back extension    L side bend directional preference    Medbridge Access Code: [email or web address]   Therapeutic exercise  Standing L side bend 10x3  No R LE numbness in standing afterward  Standing L trunk rotation 4x  Increased R LE numbness  Prone transversus abdominis contraction 10x3 with 5 second holds   No R LE and foot numbness afterwards  R S/L position with pillow between her knees.    Comfortable for pt.    Sitting on L hip  Decreased no R LE pain afterwards.   Reviewed HEP. Pt demonstrated and verbalized understanding. Handout provided.   Improved exercise technique, movement at target joints, use of target muscles after mod verbal, visual, tactile cues.    Manual therapy prone R UPA to L5 TP grade 1. R sciatic discomfort, eases with rest Prone L UPA to L5, L4, L3 TP grade 1 to 3-, decreased R LE numbness, only R foot numbness left  Prone STM R lumbar paraspinal muscle to decrease tension. R posterior hip symptoms, eases with rest      Response to treatment Decreased R LE pain to 3/10 after session in sitting   Clinical impression Pt is a 34 year old female who came to physical therapy secondary to low back pain with B LE radiating symptoms R > L along the L5 dermatome. She also presents with neck pain and B UE symptoms, irritability of low back and B LE symptoms, L lumbar side bend directional preference, limited lumbar AROM with reproduction of pain, TTP, bilateral hip weakness, altered gait pattern and posture, and difficulty performing tasks which involve prolonged standing, sitting, and walking. Pt will benefit from skilled physical therapy services to address the aforementioned  deficits.       PT Education - 08/07/19 1922    Education Details ther-ex, HEP, plan of care    Person(s) Educated Patient    Methods Explanation;Demonstration;Tactile cues;Verbal cues;Handout    Comprehension Verbalized understanding;Returned demonstration            PT Short Term Goals - 08/07/19 1922      PT SHORT TERM GOAL #1   Title Patient will be independent with her HEP to decrease pain, improve ability to perform sitting, standing tasks more comfortably.    Baseline Pt has started her HEP (08/07/2019)    Time 3    Period Weeks    Status New    Target Date 08/28/19             PT Long Term Goals - 08/07/19 1923      PT LONG TERM GOAL #1   Title Patient will have a decrease in R LE pain to 3/10 or less at worst and L LE pain to 2/10 or less at worst to promote ability to perform sitting and standing tasks such as chores more comfortably.    Baseline 100/10 R LE, 6/10 L LE pain at most for the past 3 months (08/07/2019)    Time 8    Period Weeks    Status New    Target Date 10/02/19      PT LONG TERM GOAL #2   Title Patient will have a decrease in low back pain to 3/10 or less at most to promote ability to perform chores, work duties more comfortably.    Baseline 10/10 back pain at most for the  past month (08/07/2019)    Time 8    Period Weeks    Status New    Target Date 10/02/19      PT LONG TERM GOAL #3   Title Patient will have a decrease in neck pain to 2/10 or less at most to promote ability to perform functional tasks more comfortably.    Baseline 5/10 neck pain at most for the past 3 months (08/07/2019)    Time 8    Period Weeks    Status New    Target Date 10/02/19      PT LONG TERM GOAL #4   Title Patient will improve hip extension strength by at least 1/2 MMT grade to promote ability to perform standing tasks more comfortably.    Baseline hip extension 3/5 R, 4-/5 L (08/07/2019)    Time 8    Period Weeks    Status New    Target Date 10/02/19                   Plan - 08/07/19 1914    Clinical Impression Statement Pt is a 34 year old female who came to physical therapy secondary to low back pain with B LE radiating symptoms R > L along the L5 dermatome. She also presents with neck pain and B UE symptoms, irritability of low back and B LE symptoms, L lumbar side bend directional preference, limited lumbar AROM with reproduction of pain, TTP, bilateral hip weakness, altered gait pattern and posture, and difficulty performing tasks which involve prolonged standing, sitting, and walking. Pt will benefit from skilled physical therapy services to address the aforementioned deficits.    Personal Factors and Comorbidities Comorbidity 3+;Past/Current Experience;Profession;Time since onset of injury/illness/exacerbation    Comorbidities depression, HTN, anxiety    Examination-Activity Limitations Bathing;Squat;Bed Mobility;Stairs;Lift;Bend;Locomotion Level;Stand;Caring for Others;Toileting;Carry;Transfers;Sit;Dressing;Sleep    Stability/Clinical Decision Making Evolving/Moderate complexity   LE pain seem to be worsening since onset based on subjective reports   Clinical Decision Making Moderate    Rehab Potential Fair    PT Frequency 2x / week    PT Duration 8 weeks    PT Treatment/Interventions Neuromuscular re-education;Therapeutic activities;Therapeutic exercise;Patient/family education;Manual techniques;Dry needling;Spinal Manipulations;Joint Manipulations;Aquatic Therapy;Electrical Stimulation;Iontophoresis 4mg /ml Dexamethasone;Traction    PT Next Visit Plan posture, core muscle activation, hip strengthening, joint mobs for pain control, manual techniques, modalities PRN    PT Home Exercise Plan Medbridge Access Code [email or web address]    Consulted and Agree with Plan of Care Patient           Patient will benefit from skilled therapeutic intervention in order to improve the following deficits and impairments:  Pain, Postural dysfunction, Impaired  UE functional use, Improper body mechanics, Difficulty walking, Decreased strength, Decreased range of motion, Decreased activity tolerance, Abnormal gait  Visit Diagnosis: Chronic bilateral low back pain, unspecified whether sciatica present - Plan: PT plan of care cert/re-cert  Radiculopathy, lumbosacral region - Plan: PT plan of care cert/re-cert  Muscle weakness (generalized) - Plan: PT plan of care cert/re-cert  Difficulty in walking, not elsewhere classified - Plan: PT plan of care cert/re-cert  Cervicalgia - Plan: PT plan of care cert/re-cert     Problem List Patient Active Problem List   Diagnosis Date Noted  . Endometriosis determined by laparoscopy 02/19/2017  . S/P myomectomy 11/02/2016  . Right ovarian cyst 08/07/2016    Joneen Boers PT, DPT   08/07/2019, 7:43 PM  [facility] [address]  [place], [place], [address] Phone: [phone]   Fax:  [phone]  Name: TANESSA TIDD MRN: [id number] Date of Birth: 12/07/1985

## 2019-08-07 NOTE — Patient Instructions (Addendum)
  Lay on your right side with pillows between your knees     When sitting, sit on your left hip comfortably.      Access Code: [email or web address] URL: [email or web address] Date: 08/07/2019 Prepared by: Joneen Boers  Exercises Standing Sidebends - 1 x daily - 7 x weekly - 3 sets - 10 reps - 5 seconds hold Supine Transversus Abdominis Bracing - Hands on Stomach - 1 x daily - 7 x weekly - 3 sets - 10 reps - 5 seconds hold

## 2019-08-13 ENCOUNTER — Ambulatory Visit: Payer: 59

## 2019-08-13 ENCOUNTER — Other Ambulatory Visit: Payer: Self-pay

## 2019-08-13 DIAGNOSIS — M542 Cervicalgia: Secondary | ICD-10-CM | POA: Diagnosis not present

## 2019-08-13 DIAGNOSIS — M6281 Muscle weakness (generalized): Secondary | ICD-10-CM | POA: Diagnosis not present

## 2019-08-13 DIAGNOSIS — R262 Difficulty in walking, not elsewhere classified: Secondary | ICD-10-CM

## 2019-08-13 DIAGNOSIS — G8929 Other chronic pain: Secondary | ICD-10-CM | POA: Diagnosis not present

## 2019-08-13 DIAGNOSIS — M5417 Radiculopathy, lumbosacral region: Secondary | ICD-10-CM | POA: Diagnosis not present

## 2019-08-13 DIAGNOSIS — M545 Low back pain, unspecified: Secondary | ICD-10-CM

## 2019-08-13 NOTE — Therapy (Signed)
[facility] [address], [place], [address] Phone: [phone]   Fax:  [phone]  Physical Therapy Treatment  Patient Details  Name: Rachel Garrett MRN: [id number] Date of Birth: 1985-10-19 Referring Provider (PT): Marin Olp, [place]   Encounter Date: 08/13/2019   PT End of Session - 08/13/19 1733    Visit Number 2    Number of Visits 17    Date for PT Re-Evaluation 10/02/19    PT Start Time 4166    PT Stop Time 1844    PT Time Calculation (min) 70 min    Activity Tolerance Patient tolerated treatment well    Behavior During Therapy [facility] for tasks assessed/performed           Past Medical History:  Diagnosis Date  . Anemia   . Anxiety   . Asthma    well controlled  . Concussion   . Depression   . Family history of adverse reaction to anesthesia    sister n/v, paternalcousin had trouble  waking  up  . GERD (gastroesophageal reflux disease)    occ  . Hypertension   . Migraines    migraines    Past Surgical History:  Procedure Laterality Date  . CHROMOPERTUBATION N/A 02/27/2019   Procedure: CHROMOPERTUBATION;  Surgeon: Malachy Mood, MD;  Location: [facility];  Service: Gynecology;  Laterality: N/A;  . HYSTEROSCOPY N/A 09/04/2017   Procedure: HYSTEROSCOPY;  Surgeon: Malachy Mood, MD;  Location: [facility];  Service: Gynecology;  Laterality: N/A;  . LAPAROSCOPIC OVARIAN CYSTECTOMY N/A 02/27/2019   Procedure: LAPAROSCOPIC OVARIAN CYSTECTOMY;  Surgeon: Malachy Mood, MD;  Location: [facility];  Service: Gynecology;  Laterality: N/A;  . LAPAROSCOPY N/A 09/04/2017   Procedure: LAPAROSCOPY DIAGNOSTIC;  Surgeon: Malachy Mood, MD;  Location: [facility];  Service: Gynecology;  Laterality: N/A;  . LAPAROTOMY N/A 11/02/2016   Procedure: LAPAROTOMY;  Surgeon: Malachy Mood, MD;  Location: [facility];  Service: Gynecology;  Laterality: N/A;  . 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 MYOMECTOMY N/A 11/02/2016    Procedure: MYOMECTOMY;  Surgeon: Malachy Mood, MD;  Location: [facility];  Service: Gynecology;  Laterality: N/A;  . OVARIAN CYST REMOVAL Right 11/02/2016   Procedure: OVARIAN CYSTECTOMY;  Surgeon: Malachy Mood, MD;  Location: [facility];  Service: Gynecology;  Laterality: Right;  . TONSILLECTOMY    . TONSILLECTOMY      There were no vitals filed for this visit.   Subjective Assessment - 08/13/19 1736    Subjective R low back and R thigh and knee are currently numb. Was ok after last session. Took her medicine an hour ago. 4/10 R lateral thigh (L5 dermatome) pain currently. 3/10 L latearl thigh and hip pain proximally (L5 dermatome)    Pertinent History Neck and back pain. Back pain is currently bothering her more. Has L4/5 ruptured disc and has shooting pain R LE (L5 dermatome). R LE goes numb. ALso has L L5 dermatome symptoms, not past the knee. Pain begain April 2021. 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 Pt had to tilt the patient back to wheel pt through the door threshold. Pt was in a lot of pain that night.  LE bothers her more than her back. R LE feels like is swelling from the inside but has never swelled.  Both UE would lock up but not knows why. Pt UE would freeze if she tries to open a door knob.  B UE locking has been happening for 2 years, sudden onset. Pt was pressing down on a pack and play and her R arm went numb suddenly followed by an ache. Felt like a "charlie horse" but in her arm. Had and imaging for her neck which showed a ruptured disc.  Last time her arms locked up was in January 2021 L UE with pain around her arm pit (around teres major and subscapularis, serratus anterior area).    Patient Stated Goals Get rid of the pain, be able to stand longer, be able to stand and wash dishes at home.    Currently in Pain? Yes    Pain Score 4     Pain Onset More than a month ago                                      PT Education - 08/13/19 1741    Education Details ther-ex    Person(s) Educated Patient    Methods Explanation;Demonstration;Tactile cues;Verbal cues    Comprehension Returned demonstration;Verbalized understanding            Objective   Latex band allergies Blood pressure controlled per pt    Antalgic, decreased stance L LE   Increased numbness with back extension    L side bend directional preference    Medbridge Access Code: [email or web address]    Therapeutic exercise  Sitting with lumbar towel roll  B scapular retraction 10x3 to promote thoracic extension  Transversus abdominis contraction 10x5 seconds for 2 sets  No numbness in sitting afterwards   Seated R hip extension isometrics 10x2 with 5 seconds. Increased R LE symptoms to foot   Seated thoracic extension on chair 10x3. Increased R LE symptoms   Seated chin tucks 10x3  Standing L side bend 10x3 with 5 seconds  Increased R LE weakness sensation   Standing manual L lateral shift correction 5 seconds x 3  Increased R lateral thigh symptoms   After supine manual lumbar traction: Supine transversus abdominis contraction 10x5 seconds   Standing glute max and transversus abdominis contraction: increased symptoms Standing glute max squeeze: increased R LE symptoms Standing transversus abdominis contraction: increased R LE symptoms Standing B scapular retraction: increased R LE symptoms Standing with shifting  To the R: increased symptoms  To the L: decreased R LE symptoms  Standing L side bend isometrics gentle 10x5 seconds  Able to perform, no increase in symptoms   Try supine pallof press with legs straight next visit if appropriate      Improved exercise technique, movement at target joints, use of target muscles after mod verbal, visual, tactile cues.     Manual therapy Supine manual lumbar traction in hooklying   Decreased low back  pain, increased R LE symptoms  Then with legs straight. No R LE symptoms aftewards    Response to treatment No R LE symptoms in supine after manual lumbar traction   Clinical impression Decreased R LE symptoms with treatment to decrease pressure to R low back without LE neural tension such as with supine manual lumbar traction with legs straight and standing L LE weight shifting (L lumbar side bend secondary to R pelvic drop). Irritable symptoms. Pt tolerated session well without aggravation of symptoms (4/10 R LE pain after session). Symptoms decreased in supine with traction, symptoms increase when pt is upright in either sitting or standing secondary to pressure. Pt will  benefit from continued skilled physical therapy services to decrease pain, improve strength and function.      PT Short Term Goals - 08/07/19 1922      PT SHORT TERM GOAL #1   Title Patient will be independent with her HEP to decrease pain, improve ability to perform sitting, standing tasks more comfortably.    Baseline Pt has started her HEP (08/07/2019)    Time 3    Period Weeks    Status New    Target Date 08/28/19             PT Long Term Goals - 08/07/19 1923      PT LONG TERM GOAL #1   Title Patient will have a decrease in R LE pain to 3/10 or less at worst and L LE pain to 2/10 or less at worst to promote ability to perform sitting and standing tasks such as chores more comfortably.    Baseline 100/10 R LE, 6/10 L LE pain at most for the past 3 months (08/07/2019)    Time 8    Period Weeks    Status New    Target Date 10/02/19      PT LONG TERM GOAL #2   Title Patient will have a decrease in low back pain to 3/10 or less at most to promote ability to perform chores, work duties more comfortably.    Baseline 10/10 back pain at most for the past month (08/07/2019)    Time 8    Period Weeks    Status New    Target Date 10/02/19      PT LONG TERM GOAL #3   Title Patient will have a decrease in neck  pain to 2/10 or less at most to promote ability to perform functional tasks more comfortably.    Baseline 5/10 neck pain at most for the past 3 months (08/07/2019)    Time 8    Period Weeks    Status New    Target Date 10/02/19      PT LONG TERM GOAL #4   Title Patient will improve hip extension strength by at least 1/2 MMT grade to promote ability to perform standing tasks more comfortably.    Baseline hip extension 3/5 R, 4-/5 L (08/07/2019)    Time 8    Period Weeks    Status New    Target Date 10/02/19                 Plan - 08/13/19 1855    Clinical Impression Statement Decreased R LE symptoms with treatment to decrease pressure to R low back without LE neural tension such as with supine manual lumbar traction with legs straight and standing L LE weight shifting (L lumbar side bend secondary to R pelvic drop). Irritable symptoms. Pt tolerated session well without aggravation of symptoms (4/10 R LE pain after session). Symptoms decreased in supine with traction, symptoms increase when pt is upright in either sitting or standing secondary to pressure. Pt will benefit from continued skilled physical therapy services to decrease pain, improve strength and function.    Personal Factors and Comorbidities Comorbidity 3+;Past/Current Experience;Profession;Time since onset of injury/illness/exacerbation    Comorbidities depression, HTN, anxiety    Examination-Activity Limitations Bathing;Squat;Bed Mobility;Stairs;Lift;Bend;Locomotion Level;Stand;Caring for Others;Toileting;Carry;Transfers;Sit;Dressing;Sleep    Stability/Clinical Decision Making Evolving/Moderate complexity   LE pain seem to be worsening since onset based on subjective reports   Rehab Potential Fair    PT Frequency 2x / week  PT Duration 8 weeks    PT Treatment/Interventions Neuromuscular re-education;Therapeutic activities;Therapeutic exercise;Patient/family education;Manual techniques;Dry needling;Spinal  Manipulations;Joint Manipulations;Aquatic Therapy;Electrical Stimulation;Iontophoresis 4mg /ml Dexamethasone;Traction    PT Next Visit Plan posture, core muscle activation, hip strengthening, joint mobs for pain control, manual techniques, modalities PRN    PT Home Exercise Plan Medbridge Access Code [email or web address]    Consulted and Agree with Plan of Care Patient           Patient will benefit from skilled therapeutic intervention in order to improve the following deficits and impairments:  Pain, Postural dysfunction, Impaired UE functional use, Improper body mechanics, Difficulty walking, Decreased strength, Decreased range of motion, Decreased activity tolerance, Abnormal gait  Visit Diagnosis: Chronic bilateral low back pain, unspecified whether sciatica present  Radiculopathy, lumbosacral region  Muscle weakness (generalized)  Difficulty in walking, not elsewhere classified     Problem List Patient Active Problem List   Diagnosis Date Noted  . Endometriosis determined by laparoscopy 02/19/2017  . S/P myomectomy 11/02/2016  . Right ovarian cyst 08/07/2016    Joneen Boers PT, DPT   08/13/2019, 6:58 PM  [facility] [address], [place], [address] Phone: [phone]   Fax:  [phone]  Name: Rachel Garrett MRN: [id number] Date of Birth: 03-16-1985

## 2019-08-19 ENCOUNTER — Ambulatory Visit: Payer: 59

## 2019-08-20 DIAGNOSIS — M5116 Intervertebral disc disorders with radiculopathy, lumbar region: Secondary | ICD-10-CM | POA: Diagnosis not present

## 2019-08-20 DIAGNOSIS — M5441 Lumbago with sciatica, right side: Secondary | ICD-10-CM | POA: Diagnosis not present

## 2019-08-25 ENCOUNTER — Ambulatory Visit: Payer: 59

## 2019-08-27 ENCOUNTER — Ambulatory Visit: Payer: 59

## 2019-08-28 ENCOUNTER — Ambulatory Visit: Payer: 59

## 2019-08-28 ENCOUNTER — Telehealth: Payer: Self-pay

## 2019-08-28 NOTE — Telephone Encounter (Signed)
No show for 08/27/2019 appointment. Called patient that day who said that she lost track of the time. Rescheduled appointment for 08/28/2019 at 2:30 pm

## 2019-09-01 ENCOUNTER — Ambulatory Visit: Payer: 59

## 2019-09-03 ENCOUNTER — Ambulatory Visit: Payer: 59

## 2019-09-03 DIAGNOSIS — M5441 Lumbago with sciatica, right side: Secondary | ICD-10-CM | POA: Diagnosis not present

## 2019-09-09 ENCOUNTER — Other Ambulatory Visit: Payer: Self-pay

## 2019-09-09 ENCOUNTER — Ambulatory Visit: Payer: 59 | Attending: Student

## 2019-09-09 ENCOUNTER — Telehealth: Payer: Self-pay

## 2019-09-09 NOTE — Telephone Encounter (Signed)
No show. Called patient who said that she just got a shot for her back and is supposed to take it easy. Pt was informed of multiple missed visit and was asked if she would like for [place] to remove her from the schedule. Pt states that we can. Pt was informed that her appointments are currently removed and if she would like to schedule more PT, she can call back and do so.

## 2019-09-10 ENCOUNTER — Ambulatory Visit: Payer: 59 | Admitting: Gastroenterology

## 2019-09-11 ENCOUNTER — Ambulatory Visit: Payer: 59

## 2019-09-12 ENCOUNTER — Other Ambulatory Visit: Payer: Self-pay | Admitting: Physical Medicine & Rehabilitation

## 2019-09-12 DIAGNOSIS — M5116 Intervertebral disc disorders with radiculopathy, lumbar region: Secondary | ICD-10-CM | POA: Diagnosis not present

## 2019-09-12 DIAGNOSIS — M5441 Lumbago with sciatica, right side: Secondary | ICD-10-CM | POA: Diagnosis not present

## 2019-09-16 ENCOUNTER — Ambulatory Visit: Payer: 59

## 2019-09-18 ENCOUNTER — Ambulatory Visit: Payer: 59

## 2019-09-22 ENCOUNTER — Ambulatory Visit: Payer: 59

## 2019-09-24 DIAGNOSIS — Z20822 Contact with and (suspected) exposure to covid-19: Secondary | ICD-10-CM | POA: Diagnosis not present

## 2019-09-30 DIAGNOSIS — M5441 Lumbago with sciatica, right side: Secondary | ICD-10-CM | POA: Diagnosis not present

## 2019-09-30 DIAGNOSIS — M5116 Intervertebral disc disorders with radiculopathy, lumbar region: Secondary | ICD-10-CM | POA: Diagnosis not present

## 2019-10-15 DIAGNOSIS — G8929 Other chronic pain: Secondary | ICD-10-CM | POA: Diagnosis not present

## 2019-10-15 DIAGNOSIS — M5441 Lumbago with sciatica, right side: Secondary | ICD-10-CM | POA: Diagnosis not present

## 2019-10-17 DIAGNOSIS — M48061 Spinal stenosis, lumbar region without neurogenic claudication: Secondary | ICD-10-CM | POA: Insufficient documentation

## 2019-10-17 DIAGNOSIS — M5441 Lumbago with sciatica, right side: Secondary | ICD-10-CM | POA: Diagnosis not present

## 2019-10-30 DIAGNOSIS — M79604 Pain in right leg: Secondary | ICD-10-CM | POA: Diagnosis not present

## 2019-11-03 ENCOUNTER — Other Ambulatory Visit: Payer: Self-pay | Admitting: Obstetrics and Gynecology

## 2019-11-03 NOTE — Telephone Encounter (Signed)
Please advise if refill is appropriate 

## 2019-11-05 ENCOUNTER — Other Ambulatory Visit: Payer: Self-pay | Admitting: Obstetrics and Gynecology

## 2019-11-20 ENCOUNTER — Other Ambulatory Visit: Payer: Self-pay | Admitting: Family Medicine

## 2019-11-30 DIAGNOSIS — N3000 Acute cystitis without hematuria: Secondary | ICD-10-CM | POA: Diagnosis not present

## 2019-12-02 DIAGNOSIS — Z20822 Contact with and (suspected) exposure to covid-19: Secondary | ICD-10-CM | POA: Diagnosis not present

## 2019-12-04 ENCOUNTER — Ambulatory Visit: Admit: 2019-12-04 | Discharge status: Against Medical Advice | Payer: 59

## 2019-12-04 DIAGNOSIS — J069 Acute upper respiratory infection, unspecified: Secondary | ICD-10-CM | POA: Diagnosis not present

## 2019-12-04 DIAGNOSIS — Z20822 Contact with and (suspected) exposure to covid-19: Secondary | ICD-10-CM | POA: Diagnosis not present

## 2019-12-10 ENCOUNTER — Other Ambulatory Visit: Payer: Self-pay | Admitting: Family Medicine

## 2019-12-11 ENCOUNTER — Other Ambulatory Visit: Payer: Self-pay | Admitting: Physical Medicine & Rehabilitation

## 2019-12-16 DIAGNOSIS — R0781 Pleurodynia: Secondary | ICD-10-CM | POA: Diagnosis not present

## 2019-12-16 DIAGNOSIS — J209 Acute bronchitis, unspecified: Secondary | ICD-10-CM | POA: Diagnosis not present

## 2019-12-17 ENCOUNTER — Other Ambulatory Visit: Payer: Self-pay | Admitting: Dermatology

## 2019-12-17 DIAGNOSIS — L732 Hidradenitis suppurativa: Secondary | ICD-10-CM | POA: Diagnosis not present

## 2019-12-18 DIAGNOSIS — Z0000 Encounter for general adult medical examination without abnormal findings: Secondary | ICD-10-CM | POA: Diagnosis not present

## 2019-12-18 DIAGNOSIS — Z3200 Encounter for pregnancy test, result unknown: Secondary | ICD-10-CM | POA: Diagnosis not present

## 2019-12-18 DIAGNOSIS — I10 Essential (primary) hypertension: Secondary | ICD-10-CM | POA: Diagnosis not present

## 2019-12-18 DIAGNOSIS — Z1389 Encounter for screening for other disorder: Secondary | ICD-10-CM | POA: Diagnosis not present

## 2019-12-25 DIAGNOSIS — F418 Other specified anxiety disorders: Secondary | ICD-10-CM | POA: Diagnosis not present

## 2019-12-29 ENCOUNTER — Other Ambulatory Visit: Payer: Self-pay | Admitting: Physician Assistant

## 2020-01-21 DIAGNOSIS — Z20822 Contact with and (suspected) exposure to covid-19: Secondary | ICD-10-CM | POA: Diagnosis not present

## 2020-01-21 DIAGNOSIS — G43901 Migraine, unspecified, not intractable, with status migrainosus: Secondary | ICD-10-CM | POA: Diagnosis not present

## 2020-02-18 ENCOUNTER — Other Ambulatory Visit: Payer: Self-pay | Admitting: Vascular Surgery

## 2020-02-19 ENCOUNTER — Encounter: Payer: Self-pay | Admitting: Obstetrics and Gynecology

## 2020-02-19 ENCOUNTER — Ambulatory Visit (INDEPENDENT_AMBULATORY_CARE_PROVIDER_SITE_OTHER): Payer: 59 | Admitting: Obstetrics and Gynecology

## 2020-02-19 ENCOUNTER — Other Ambulatory Visit: Payer: Self-pay

## 2020-02-19 VITALS — BP 124/72 | Ht 67.5 in | Wt 174.0 lb

## 2020-02-19 DIAGNOSIS — N809 Endometriosis, unspecified: Secondary | ICD-10-CM

## 2020-02-19 MED ORDER — ORILISSA 150 MG PO TABS: 1.0000 | ORAL_TABLET | Freq: Every day | ORAL | 5 refills | Status: DC

## 2020-02-19 NOTE — Progress Notes (Signed)
Obstetrics & Gynecology Office Visit   Chief Complaint:  Chief Complaint  Patient presents with  . Follow-up    History of Present Illness: 35 y.o. G2P0020 presenting for medication follow up for a diagnosis of laproscopic and pathology proven endometriosis.  She is currently being managed with [place].   The patient reports good control of symptoms on her current regimen.  On her current medication regimen the patient has achieved amenorrhea.   She has not noted any side-effects or new symptoms.    Review of Systems: Review of Systems  Constitutional: Negative.   Gastrointestinal: Negative.   Genitourinary: Negative.    Past Medical History:  Past Medical History:  Diagnosis Date  . Anemia   . Anxiety   . Asthma    well controlled  . Concussion   . Depression   . Family history of adverse reaction to anesthesia    sister n/v, paternalcousin had trouble  waking  up  . GERD (gastroesophageal reflux disease)    occ  . Hypertension   . Migraines    migraines    Past Surgical History:  Past Surgical History:  Procedure Laterality Date  . CHROMOPERTUBATION N/A 02/27/2019   Procedure: CHROMOPERTUBATION;  Surgeon: Malachy Mood, MD;  Location: [facility] ORS;  Service: Gynecology;  Laterality: N/A;  . HYSTEROSCOPY N/A 09/04/2017   Procedure: HYSTEROSCOPY;  Surgeon: Malachy Mood, MD;  Location: [facility] ORS;  Service: Gynecology;  Laterality: N/A;  . LAPAROSCOPIC OVARIAN CYSTECTOMY N/A 02/27/2019   Procedure: LAPAROSCOPIC OVARIAN CYSTECTOMY;  Surgeon: Malachy Mood, MD;  Location: [facility] OR[facility];  Service: Gynecology;  Laterality: N/A;  . LAPAROSCOPY N/A 09/04/2017   Procedure: LAPAROSCOPY DIAGNOSTIC;  Surgeon: Malachy Mood, MD;  Location: [facility] OR[facility];  Service: Gynecology;  Laterality: N/A;  . LAPAROTOMY N/A 11/02/2016   Procedure: LAPAROTOMY;  Surgeon: Malachy Mood, MD;  Location: [facility] OR[facility];  Service: Gynecology;  Laterality: N/A;  . 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  MYOMECTOMY N/A 11/02/2016   Procedure: MYOMECTOMY;  Surgeon: Malachy Mood, MD;  Location: [facility] OR[facility];  Service: Gynecology;  Laterality: N/A;  . OVARIAN CYST REMOVAL Right 11/02/2016   Procedure: OVARIAN CYSTECTOMY;  Surgeon: Malachy Mood, MD;  Location: [facility] OR[facility];  Service: Gynecology;  Laterality: Right;  . TONSILLECTOMY    . TONSILLECTOMY      Gynecologic History: No LMP recorded. (Menstrual status: Oral contraceptives).  Obstetric History: G2P0020  Family History:  Family History  Problem Relation Age of Onset  . Breast cancer Maternal Aunt        [employer]  . Breast cancer Paternal Aunt 22  . Cervical cancer Maternal Aunt   . Breast cancer Paternal 11        Twin half sisters of dad    Social History:  Social History   Socioeconomic History  . Marital status: Single    Spouse name: Not on file  . Number of children: Not on file  . Years of education: Not on file  . Highest education level: Not on file  Occupational History  . Not on file  Tobacco Use  . Smoking status: Current Every Day Smoker    Packs/day: 0.25    Years: 10.00    Pack years: 2.50    Types: Cigarettes  . Smokeless tobacco: Never Used  Vaping Use  . Vaping Use: Never used  Substance and Sexual Activity  . Alcohol use: Yes    Alcohol/week: 2.0 standard drinks    Types: 2 Cans of beer  per week    Comment: weekend  . Drug use: Not Currently    Comment: PT DENIES BUT HAS A + UDS IN 2017 FOR MARIJUANA  . Sexual activity: Yes    Birth control/protection: None  Other Topics Concern  . Not on file  Social History Narrative  . Not on file   Social Determinants of Health   Financial Resource Strain: Not on file  Food Insecurity: Not on file  Transportation Needs: Not on file  Physical Activity: Not on file  Stress: Not on file  Social Connections: Not on file  Intimate Partner Violence: Not on file    Allergies:  Allergies  Allergen Reactions  . Chocolate Flavor Other (See  Comments)    Causes migraine  . Other Itching and Swelling    Allergic to bees  . Bee Venom Swelling  . Benadryl [Diphenhydramine Hcl (Sleep)] Swelling  . Chocolate     migraines  . Estrogens     Increases chances of having stroke  . Lactose Intolerance (Gi) Diarrhea and Nausea And Vomiting  . Latex Itching and Swelling  . Ultram [Tramadol Hcl] Itching    Medications: Prior to Admission medications   Medication Sig Start Date End Date Taking? Authorizing Provider  citalopram (CELEXA) 20 MG tablet Take 1 tablet by mouth daily. 07/23/19  Yes [provider]  fluticasone (FLONASE) 50 MCG/ACT nasal spray Place into both nostrils daily.   Yes [provider]  gabapentin (NEURONTIN) 300 MG capsule Take 1 capsule by mouth 3 (three) times daily. 06/20/19  Yes [provider]  hydrochlorothiazide (HYDRODIURIL) 12.5 MG tablet Take 1 tablet (12.5 mg total) by mouth daily. 04/04/19  Yes Hassell Done, Mary-Margaret, FNP  methocarbamol (ROBAXIN) 500 MG tablet  06/17/19  Yes [provider]  ondansetron (ZOFRAN ODT) 4 MG disintegrating tablet Take 1 tablet (4 mg total) by mouth every 6 (six) hours as needed for nausea. 06/11/19  Yes Malachy Mood, MD  rizatriptan (MAXALT) 10 MG tablet Take 10 mg by mouth every 2 (two) hours as needed for migraine.  04/06/17  Yes [provider]  topiramate (TOPAMAX) 100 MG tablet Take 1 tablet (100 mg total) by mouth every morning. 01/05/19  Yes Wieters, Hallie C, PA-C  valACYclovir (VALTREX) 1000 MG tablet TAKE 1 TABLET BY MOUTH ONCE DAILY 11/05/19  Yes Malachy Mood, MD  celecoxib (CELEBREX) 100 MG capsule Take 100 mg by mouth 2 (two) times daily. Patient not taking: Reported on 02/19/2020 06/17/19   [provider]  Elagolix Sodium (ORILISSA) 150 MG TABS Take 1 tablet by mouth daily. 02/19/20   Malachy Mood, MD  oxycodone (OXY-IR) 5 MG capsule Take 5 mg by mouth every 4 (four) hours as needed.    [provider]  oxyCODONE-acetaminophen (PERCOCET/ROXICET) 5-325 MG tablet Take 1 tablet by mouth in the morning and at bedtime. Patient not taking: Reported on 02/19/2020 09/04/19   [provider]    Physical Exam Vitals:  Vitals:   02/19/20 1631  BP: 124/72   No LMP recorded. (Menstrual status: Oral contraceptives).  General: NAD, well nourished appears stated age 60: normocephalic, anicteric Pulmonary: No increased work of breathing Neurologic: Grossly intact Psychiatric: mood appropriate, affect full  Female chaperone present for pelvic  portions of the physical exam  Assessment: 35 y.o. G2P0020 follow up endometriosis  Plan: Problem List Items Addressed This Visit      Other   Endometriosis determined by laparoscopy - Primary     1) Good response  to [place] some vasomotor symptoms.  Is interested in proceeding with HSG to evaluate tubal patency.  2) A total of 15 minutes were spent in face-to-face contact with the patient during this encounter with over half of that time devoted to counseling and coordination of care.  3) Return in about 6 months (around 08/18/2020) for annual.   Malachy Mood, MD, [place], [place] 02/19/2020, 5:04 PM

## 2020-02-20 ENCOUNTER — Other Ambulatory Visit: Payer: Self-pay | Admitting: Obstetrics and Gynecology

## 2020-02-20 MED ORDER — TRAZODONE HCL 50 MG PO TABS: 50.0000 mg | ORAL_TABLET | Freq: Every evening | ORAL | 4 refills | Status: DC | PRN

## 2020-02-25 ENCOUNTER — Other Ambulatory Visit: Payer: Self-pay | Admitting: Family Medicine

## 2020-02-25 ENCOUNTER — Other Ambulatory Visit (HOSPITAL_COMMUNITY): Payer: Self-pay | Admitting: Family Medicine

## 2020-02-25 ENCOUNTER — Telehealth: Payer: Self-pay | Admitting: Obstetrics and Gynecology

## 2020-02-25 NOTE — Telephone Encounter (Signed)
Pt called today wanting to schedule her HSG for upcoming cycle. Based on the app she uses, day one should fall on 2/2-7. AMS is in OR on 2/8 so I was going to schedule if room available at [facility] (it is). Adv pt to call me and confirm her actual day 1 so I can make sure 2/8 is still within the procedure window.  Staebler, please place order for HSG so that I can schedule. Thx

## 2020-02-26 ENCOUNTER — Telehealth: Payer: Self-pay | Admitting: Obstetrics and Gynecology

## 2020-02-26 NOTE — Telephone Encounter (Signed)
Pt called back today to see if AMS had placed the orders for her HSG. I adv that he is back in the office tomorrow and I will request them so that I can schedule her for 2/8.

## 2020-02-27 ENCOUNTER — Other Ambulatory Visit: Payer: Self-pay | Admitting: Obstetrics and Gynecology

## 2020-02-27 DIAGNOSIS — N809 Endometriosis, unspecified: Secondary | ICD-10-CM

## 2020-02-27 DIAGNOSIS — N979 Female infertility, unspecified: Secondary | ICD-10-CM

## 2020-02-27 NOTE — Telephone Encounter (Signed)
Order is in.

## 2020-03-04 ENCOUNTER — Other Ambulatory Visit: Payer: Self-pay | Admitting: Physical Medicine & Rehabilitation

## 2020-03-05 ENCOUNTER — Telehealth: Payer: Self-pay

## 2020-03-05 ENCOUNTER — Other Ambulatory Visit: Payer: Self-pay | Admitting: Obstetrics and Gynecology

## 2020-03-05 MED ORDER — FLUCONAZOLE 150 MG PO TABS: 150.0000 mg | ORAL_TABLET | Freq: Once | ORAL | 0 refills | Status: AC

## 2020-03-05 NOTE — Progress Notes (Signed)
dif

## 2020-03-05 NOTE — Telephone Encounter (Signed)
Pt called to confirm HSG scheduled for 2/8 w [facility]. I adv to arv at [facility] at 1:15 for 1:30 test. No driving restrictions.  Pt asked if AMS is aware that she allergic to iodine? I viewed her allergies listed in her chart and adv that it is not listed. I am unable to update clinical info so I asked Otila Kluver to please update pt chart. She adv that it causes bad itching in area of contact.  I adv pt that I will let her know if this allergy interfears with the HSG.   [facility] please adv.

## 2020-03-05 NOTE — Telephone Encounter (Signed)
This test does involve iodinate contrast

## 2020-03-11 DIAGNOSIS — L732 Hidradenitis suppurativa: Secondary | ICD-10-CM | POA: Diagnosis not present

## 2020-03-15 NOTE — Telephone Encounter (Signed)
Can we have her scheduled for HSG next week.  Also this is the contrast allergy so I'm not sure if Radiology has a pretreatment protocol for these patients when we schedule it

## 2020-03-16 ENCOUNTER — Telehealth: Payer: Self-pay

## 2020-03-16 ENCOUNTER — Ambulatory Visit: Payer: 59

## 2020-03-16 NOTE — Telephone Encounter (Signed)
I rescheduled the HSG for pt w [facility]. New DOS is 2/14. I have tried to reach the patient all morning via phone. There is no answer and v/m is full. I also sent a msg through my chart to have pt rtn my call.  I called the radiology dept to inquire about the pt having an iodine and benadryl allergy. They advised that Georgianne Fick can order a steroid prep minus the benadryl.

## 2020-03-16 NOTE — Telephone Encounter (Signed)
Pt returned my call and I adv that her appt had changed. I also adv that I have been trying to reach her all morning and that her v/m is full and I was unable to leave a message.  I adv that her appt was rescheduled to 2/14 however she would like to wait until March. I asked her to call and let me know her day 1 of LMP. She will.  She would like the steroid prep and asked if there was something AMS could give her for nerves/anxiety for just the day of. I adv that I would send her request to him.

## 2020-03-22 ENCOUNTER — Ambulatory Visit: Payer: 59

## 2020-03-23 ENCOUNTER — Other Ambulatory Visit: Payer: Self-pay | Admitting: Physical Medicine & Rehabilitation

## 2020-04-09 NOTE — Telephone Encounter (Signed)
Patient is requesting Rx for pain.

## 2020-04-12 ENCOUNTER — Other Ambulatory Visit: Payer: Self-pay | Admitting: Obstetrics and Gynecology

## 2020-04-13 ENCOUNTER — Other Ambulatory Visit: Payer: Self-pay | Admitting: Physical Medicine & Rehabilitation

## 2020-04-16 ENCOUNTER — Ambulatory Visit
Admission: RE | Admit: 2020-04-16 | Discharge: 2020-04-16 | Disposition: A | Discharge status: Home / Self Care | Payer: 59 | Source: Ambulatory Visit | Attending: Obstetrics and Gynecology | Admitting: Obstetrics and Gynecology

## 2020-04-16 ENCOUNTER — Other Ambulatory Visit: Payer: Self-pay

## 2020-04-16 DIAGNOSIS — N809 Endometriosis, unspecified: Secondary | ICD-10-CM | POA: Diagnosis not present

## 2020-04-16 DIAGNOSIS — N979 Female infertility, unspecified: Secondary | ICD-10-CM | POA: Diagnosis not present

## 2020-04-16 MED ORDER — IOHEXOL 300 MG/ML  SOLN
20.0000 mL | Freq: Once | INTRAMUSCULAR | Status: AC | PRN
  Administered 2020-04-16: 20 mL

## 2020-04-16 NOTE — Procedures (Signed)
Consents signed and reviewed with patient.  Indication for procedure confirmed, Mild contrast allergy itching,  no iodine allergy was confirmed.  The catheter was primed with [id number] to eliminate air bubbles.  A sterile speculum was placed in the patient's vagina to allow visualization of the cervix.  The cervix was prepped with betadine solution.   The HSG was advanced through the external cervical os into uterine cavity, the catheter balloon was then inflated. Approximately 29mL dye injected under fluoroscopic.  Fallopian tube patent bilaterally.  Catheter removed.  Patient tolerated procedure well.   Malachy Mood, MD, [facility], [facility] 04/16/2020, 1:48 PM

## 2020-04-27 DIAGNOSIS — M48061 Spinal stenosis, lumbar region without neurogenic claudication: Secondary | ICD-10-CM | POA: Diagnosis not present

## 2020-04-27 DIAGNOSIS — M5441 Lumbago with sciatica, right side: Secondary | ICD-10-CM | POA: Diagnosis not present

## 2020-05-04 ENCOUNTER — Other Ambulatory Visit: Payer: Self-pay | Admitting: Physical Medicine & Rehabilitation

## 2020-05-13 ENCOUNTER — Other Ambulatory Visit: Payer: Self-pay

## 2020-05-13 MED FILL — Valacyclovir HCl Tab 1 GM: ORAL | 90 days supply | Qty: 90 | Fill #0 | Status: AC

## 2020-05-13 MED FILL — Gabapentin Cap 300 MG: ORAL | 30 days supply | Qty: 90 | Fill #0 | Status: AC

## 2020-05-14 ENCOUNTER — Other Ambulatory Visit: Payer: Self-pay

## 2020-05-20 ENCOUNTER — Other Ambulatory Visit: Payer: Self-pay

## 2020-05-20 MED FILL — Clindamycin Phosphate Soln 1%: CUTANEOUS | 30 days supply | Qty: 60 | Fill #0 | Status: AC

## 2020-05-24 ENCOUNTER — Other Ambulatory Visit: Payer: Self-pay

## 2020-05-24 MED FILL — Citalopram Hydrobromide Tab 20 MG (Base Equiv): ORAL | 90 days supply | Qty: 135 | Fill #0 | Status: CN

## 2020-05-24 MED FILL — Topiramate Tab 50 MG: ORAL | 90 days supply | Qty: 180 | Fill #0 | Status: AC

## 2020-05-24 MED FILL — Citalopram Hydrobromide Tab 20 MG (Base Equiv): ORAL | 90 days supply | Qty: 135 | Fill #0 | Status: AC

## 2020-05-28 ENCOUNTER — Other Ambulatory Visit: Payer: Self-pay

## 2020-05-31 ENCOUNTER — Other Ambulatory Visit: Payer: Self-pay

## 2020-05-31 DIAGNOSIS — Z113 Encounter for screening for infections with a predominantly sexual mode of transmission: Secondary | ICD-10-CM | POA: Diagnosis not present

## 2020-05-31 DIAGNOSIS — Z1159 Encounter for screening for other viral diseases: Secondary | ICD-10-CM | POA: Diagnosis not present

## 2020-05-31 DIAGNOSIS — F418 Other specified anxiety disorders: Secondary | ICD-10-CM | POA: Diagnosis not present

## 2020-05-31 DIAGNOSIS — M4800 Spinal stenosis, site unspecified: Secondary | ICD-10-CM | POA: Diagnosis not present

## 2020-06-01 ENCOUNTER — Other Ambulatory Visit: Payer: Self-pay

## 2020-06-01 MED ORDER — VARENICLINE TARTRATE 0.5 MG PO TABS
ORAL_TABLET | ORAL | 0 refills | Status: AC
  Filled 2020-06-01: qty 56, 28d supply, fill #0

## 2020-06-01 MED ORDER — METHOCARBAMOL 500 MG PO TABS
ORAL_TABLET | ORAL | 0 refills | Status: DC
  Filled 2020-06-01: qty 90, 30d supply, fill #0

## 2020-06-01 MED ORDER — VARENICLINE TARTRATE 1 MG PO TABS
1.0000 mg | ORAL_TABLET | Freq: Two times a day (BID) | ORAL | 0 refills | Status: DC
  Filled 2020-11-11: qty 56, 28d supply, fill #0
  Filled 2021-01-13: qty 56, 28d supply, fill #1

## 2020-06-01 MED ORDER — FLUOXETINE HCL 20 MG PO CAPS
20.0000 mg | ORAL_CAPSULE | Freq: Every day | ORAL | 3 refills | Status: DC
  Filled 2020-06-01: qty 90, 90d supply, fill #0

## 2020-06-02 ENCOUNTER — Other Ambulatory Visit: Payer: Self-pay

## 2020-06-03 ENCOUNTER — Other Ambulatory Visit: Payer: Self-pay

## 2020-06-07 ENCOUNTER — Other Ambulatory Visit: Payer: Self-pay

## 2020-06-09 ENCOUNTER — Other Ambulatory Visit: Payer: Self-pay | Admitting: Obstetrics and Gynecology

## 2020-06-09 MED ORDER — FLUCONAZOLE 150 MG PO TABS: 150.0000 mg | ORAL_TABLET | Freq: Once | ORAL | 0 refills | Status: AC

## 2020-06-15 ENCOUNTER — Other Ambulatory Visit: Payer: Self-pay

## 2020-06-15 DIAGNOSIS — M5441 Lumbago with sciatica, right side: Secondary | ICD-10-CM | POA: Diagnosis not present

## 2020-06-15 DIAGNOSIS — M48061 Spinal stenosis, lumbar region without neurogenic claudication: Secondary | ICD-10-CM | POA: Diagnosis not present

## 2020-06-15 DIAGNOSIS — G8929 Other chronic pain: Secondary | ICD-10-CM | POA: Diagnosis not present

## 2020-06-21 ENCOUNTER — Other Ambulatory Visit: Payer: Self-pay

## 2020-06-21 MED FILL — Clindamycin Phosphate Soln 1%: CUTANEOUS | 30 days supply | Qty: 60 | Fill #1 | Status: AC

## 2020-06-24 DIAGNOSIS — F330 Major depressive disorder, recurrent, mild: Secondary | ICD-10-CM | POA: Diagnosis not present

## 2020-06-24 DIAGNOSIS — Z1389 Encounter for screening for other disorder: Secondary | ICD-10-CM | POA: Diagnosis not present

## 2020-06-28 ENCOUNTER — Other Ambulatory Visit: Payer: Self-pay

## 2020-06-28 MED ORDER — GABAPENTIN 300 MG PO CAPS
ORAL_CAPSULE | ORAL | 5 refills | Status: AC
  Filled 2020-06-28: qty 90, 30d supply, fill #0
  Filled 2020-07-30: qty 90, 30d supply, fill #1
  Filled 2020-11-11: qty 90, 30d supply, fill #2
  Filled 2021-01-13: qty 90, 30d supply, fill #3
  Filled 2021-02-25: qty 90, 30d supply, fill #4
  Filled 2021-03-25: qty 90, 30d supply, fill #5

## 2020-06-28 MED ORDER — METHOCARBAMOL 500 MG PO TABS
ORAL_TABLET | ORAL | 2 refills | Status: DC
  Filled 2020-06-28: qty 180, 30d supply, fill #0
  Filled 2020-07-30: qty 180, 30d supply, fill #1
  Filled 2020-09-24: qty 180, 30d supply, fill #2

## 2020-06-30 ENCOUNTER — Ambulatory Visit: Payer: Self-pay

## 2020-06-30 ENCOUNTER — Telehealth: Payer: 59 | Admitting: Family

## 2020-06-30 DIAGNOSIS — R197 Diarrhea, unspecified: Secondary | ICD-10-CM | POA: Diagnosis not present

## 2020-06-30 MED ORDER — ONDANSETRON HCL 4 MG PO TABS: 4.0000 mg | ORAL_TABLET | Freq: Three times a day (TID) | ORAL | 0 refills | Status: DC | PRN

## 2020-06-30 NOTE — Progress Notes (Signed)
We are sorry that you are not feeling well.  Here is how we plan to help!  Based on what you have shared with me it looks like you have Acute Infectious Diarrhea.  Most cases of acute diarrhea are due to infections with virus and bacteria and are self-limited conditions lasting less than 14 days.  For your symptoms you may take Imodium 2 mg tablets that are over the counter at your local pharmacy. Take two tablet now and then one after each loose stool up to 6 a day.  Antibiotics are not needed for most people with diarrhea.  I have sent in   Zofran 4 mg 1 tablet every 8 hours as needed for nausea and vomiting. Just in case you become nauseous.     HOME CARE  We recommend changing your diet to help with your symptoms for the next few days.  Drink plenty of fluids that contain water salt and sugar. Sports drinks such as Gatorade may help.   You may try broths, soups, bananas, applesauce, soft breads, mashed potatoes or crackers.   You are considered infectious for as long as the diarrhea continues. Hand washing or use of alcohol based hand sanitizers is recommend.  It is best to stay out of work or school until your symptoms stop.   GET HELP RIGHT AWAY  If you have dark yellow colored urine or do not pass urine frequently you should drink more fluids.    If your symptoms worsen   If you feel like you are going to pass out (faint)  You have a new problem  MAKE SURE YOU   Understand these instructions.  Will watch your condition.  Will get help right away if you are not doing well or get worse.  Your e-visit answers were reviewed by a board certified advanced clinical practitioner to complete your personal care plan.  Depending on the condition, your plan could have included both over the counter or prescription medications.  If there is a problem please reply  once you have received a response from your provider.  Your safety is important to [place].  If you have drug  allergies check your prescription carefully.    You can use MyChart to ask questions about today's visit, request a non-urgent call back, or ask for a work or school excuse for 24 hours related to this e-Visit. If it has been greater than 24 hours you will need to follow up with your provider, or enter a new e-Visit to address those concerns.   You will get an e-mail in the next two days asking about your experience.  I hope that your e-visit has been valuable and will speed your recovery. Thank you for using e-visits.  Approximately 5 minutes was spent documenting and reviewing patient's chart.

## 2020-07-19 ENCOUNTER — Other Ambulatory Visit: Payer: Self-pay

## 2020-07-19 DIAGNOSIS — F17200 Nicotine dependence, unspecified, uncomplicated: Secondary | ICD-10-CM | POA: Diagnosis not present

## 2020-07-19 DIAGNOSIS — F330 Major depressive disorder, recurrent, mild: Secondary | ICD-10-CM | POA: Diagnosis not present

## 2020-07-19 DIAGNOSIS — Z1389 Encounter for screening for other disorder: Secondary | ICD-10-CM | POA: Diagnosis not present

## 2020-07-19 MED ORDER — FLUOXETINE HCL 40 MG PO CAPS
40.0000 mg | ORAL_CAPSULE | Freq: Every day | ORAL | 3 refills | Status: DC
  Filled 2020-07-19: qty 90, 90d supply, fill #0
  Filled 2020-11-15: qty 90, 90d supply, fill #1
  Filled 2021-02-25: qty 90, 90d supply, fill #2

## 2020-07-20 ENCOUNTER — Other Ambulatory Visit: Payer: Self-pay

## 2020-07-23 DIAGNOSIS — M5441 Lumbago with sciatica, right side: Secondary | ICD-10-CM | POA: Diagnosis not present

## 2020-07-23 DIAGNOSIS — G8929 Other chronic pain: Secondary | ICD-10-CM | POA: Diagnosis not present

## 2020-07-23 DIAGNOSIS — M48061 Spinal stenosis, lumbar region without neurogenic claudication: Secondary | ICD-10-CM | POA: Diagnosis not present

## 2020-07-30 ENCOUNTER — Other Ambulatory Visit: Payer: Self-pay

## 2020-07-30 MED FILL — Hydrochlorothiazide Cap 12.5 MG: ORAL | 90 days supply | Qty: 90 | Fill #0 | Status: AC

## 2020-07-30 MED FILL — Valacyclovir HCl Tab 1 GM: ORAL | 90 days supply | Qty: 90 | Fill #1 | Status: AC

## 2020-08-02 ENCOUNTER — Other Ambulatory Visit: Payer: Self-pay

## 2020-08-02 MED ORDER — GABAPENTIN 300 MG PO CAPS
ORAL_CAPSULE | ORAL | 0 refills | Status: DC
  Filled 2020-08-02 – 2020-09-10 (×2): qty 150, 30d supply, fill #0

## 2020-08-06 ENCOUNTER — Other Ambulatory Visit: Payer: Self-pay

## 2020-08-06 MED FILL — Clindamycin Phosphate Soln 1%: CUTANEOUS | 20 days supply | Qty: 60 | Fill #2 | Status: AC

## 2020-08-15 ENCOUNTER — Telehealth: Payer: 59 | Admitting: Family

## 2020-08-15 DIAGNOSIS — K0889 Other specified disorders of teeth and supporting structures: Secondary | ICD-10-CM

## 2020-08-15 NOTE — Progress Notes (Signed)
Based on what you shared with me, I feel your condition warrants further evaluation and I recommend that you be seen in a face to face visit.  Given you recently have had a procedures and now having pain, you need to be seen in person.    NOTE: There will be NO CHARGE for this eVisit   If you are having a true medical emergency please call 911.      For an urgent face to face visit, [place] has six urgent care centers for your convenience:     [facility] [place] [facility] [place] Get Driving Directions [phone] [place], [place] [address]    [facility]) Get Driving Directions [phone] [place], [place] [address]  [facility] [place] [facility]) Get Driving Directions [phone] [address] [place],  [place]  [address]  [facility] Get Driving Directions [phone] [address] [place], [place], [place] [address]   [facility] Get Driving Directions  [phone] [address] [place], [place] [address]    [facility] [place] Get Driving Directions [phone] [address] [place], [place] [address]  Your MyChart E-visit questionnaire answers were reviewed by a board certified advanced clinical practitioner to complete your personal care plan based on your specific symptoms.  Thank you for using e-Visits.

## 2020-08-16 DIAGNOSIS — Z20822 Contact with and (suspected) exposure to covid-19: Secondary | ICD-10-CM | POA: Diagnosis not present

## 2020-08-16 DIAGNOSIS — K047 Periapical abscess without sinus: Secondary | ICD-10-CM | POA: Diagnosis not present

## 2020-08-25 ENCOUNTER — Other Ambulatory Visit: Payer: Self-pay

## 2020-08-26 ENCOUNTER — Other Ambulatory Visit: Payer: Self-pay

## 2020-08-27 ENCOUNTER — Other Ambulatory Visit: Payer: Self-pay

## 2020-08-27 DIAGNOSIS — Z113 Encounter for screening for infections with a predominantly sexual mode of transmission: Secondary | ICD-10-CM | POA: Diagnosis not present

## 2020-08-31 ENCOUNTER — Other Ambulatory Visit: Payer: Self-pay

## 2020-09-01 ENCOUNTER — Other Ambulatory Visit: Payer: Self-pay

## 2020-09-02 ENCOUNTER — Other Ambulatory Visit: Payer: Self-pay

## 2020-09-02 MED ORDER — TOPIRAMATE 50 MG PO TABS
50.0000 mg | ORAL_TABLET | Freq: Two times a day (BID) | ORAL | 0 refills | Status: DC
  Filled 2020-09-02: qty 180, 90d supply, fill #0

## 2020-09-03 ENCOUNTER — Other Ambulatory Visit: Payer: Self-pay

## 2020-09-10 ENCOUNTER — Other Ambulatory Visit: Payer: Self-pay

## 2020-09-15 ENCOUNTER — Other Ambulatory Visit: Payer: Self-pay

## 2020-09-15 MED ORDER — CARESTART COVID-19 HOME TEST VI KIT
PACK | 0 refills | Status: DC
  Filled 2020-09-15: qty 2, 4d supply, fill #0

## 2020-09-16 ENCOUNTER — Encounter: Payer: Self-pay | Admitting: Physician Assistant

## 2020-09-16 ENCOUNTER — Telehealth: Payer: 59 | Admitting: Physician Assistant

## 2020-09-16 DIAGNOSIS — U071 COVID-19: Secondary | ICD-10-CM | POA: Diagnosis not present

## 2020-09-16 MED ORDER — ALBUTEROL SULFATE (2.5 MG/3ML) 0.083% IN NEBU: 2.5000 mg | INHALATION_SOLUTION | Freq: Four times a day (QID) | RESPIRATORY_TRACT | 1 refills | Status: AC | PRN

## 2020-09-16 MED ORDER — NAPROXEN 500 MG PO TABS: 500.0000 mg | ORAL_TABLET | Freq: Two times a day (BID) | ORAL | 0 refills | Status: DC

## 2020-09-16 MED ORDER — ONDANSETRON HCL 4 MG PO TABS: 4.0000 mg | ORAL_TABLET | Freq: Three times a day (TID) | ORAL | 0 refills | Status: DC | PRN

## 2020-09-16 MED ORDER — BENZONATATE 100 MG PO CAPS: 100.0000 mg | ORAL_CAPSULE | Freq: Three times a day (TID) | ORAL | 0 refills | Status: DC | PRN

## 2020-09-16 MED ORDER — FLUTICASONE PROPIONATE 50 MCG/ACT NA SUSP: 2.0000 | Freq: Every day | NASAL | 0 refills | Status: DC

## 2020-09-16 NOTE — Patient Instructions (Signed)
Hello Rachel Garrett,  You are being placed in the home monitoring program for COVID-19 (commonly known as Coronavirus).  This is because you are suspected to have the virus or are known to have the virus.  If you are unsure which group you fall into call your clinic.    As part of this program, you'll answer a daily questionnaire in the MyChart mobile app. You'll receive a notification through the MyChart app when the questionnaire is available. When you log in to MyChart, you'll see the tasks in your To Do activity.       Clinicians will see any answers that are concerning and take appropriate steps.  If at any point you are having a medical emergency, call 911.  If otherwise concerned call your clinic instead of coming into the clinic or hospital.  To keep from spreading the disease you should: Stay home and limit contact with other people as much as possible.  Wash your hands frequently. Cover your coughs and sneezes with a tissue, and throw used tissues in the trash.   Clean and disinfect frequently touched surfaces and objects.    Take care of yourself by: Staying home Resting Drinking fluids Take fever-reducing medications (Tylenol/Acetaminophen and Ibuprofen)  For more information on the disease go to the Centers for Disease Control and Prevention website     COVID-19: What to Do if You Are Sick CDC has updated isolation and quarantine recommendations for the public, and is revising the CDC website to reflect these changes. These recommendations do not apply to healthcare personnel and do not supersede state, local, tribal, or territorial laws, rules, andregulations. If you have a fever, cough or other symptoms, you might have COVID-19. Most people have mild illness and are able to recover at home. If you are sick: Keep track of your symptoms. If you have an emergency warning sign (including trouble breathing), call 911. Steps to help prevent the spread of COVID-19 if you are sick If  you are sick with COVID-19 or think you might have COVID-19, follow the steps below to care for yourself and to help protect other peoplein your home and community. Stay home except to get medical care Stay home. Most people with COVID-19 have mild illness and can recover at home without medical care. Do not leave your home, except to get medical care. Do not visit public areas. Take care of yourself. Get rest and stay hydrated. Take over-the-counter medicines, such as acetaminophen, to help you feel better. Stay in touch with your doctor. Call before you get medical care. Be sure to get care if you have trouble breathing, or have any other emergency warning signs, or if you think it is an emergency. Avoid public transportation, ride-sharing, or taxis. Separate yourself from other people As much as possible, stay in a specific room and away from other people and pets in your home. If possible, you should use a separate bathroom. If you need to be around other people or animals in oroutside of the home, wear a mask. Tell your close contactsthat they may have been exposed to COVID-19. An infected person can spread COVID-19 starting 48 hours (or 2 days) before the person has any symptoms or tests positive. By letting your close contacts know they may have been exposed to COVID-19, you are helping to protect everyone. Additional guidance is available for those living in close quarters and shared housing. See COVID-19 and Animals if you have questions about pets. If you are diagnosed with  COVID-19, someone from the health department may call you. Answer the call to slow the spread. Monitor your symptoms Symptoms of COVID-19 include fever, cough, or other symptoms. Follow care instructions from your healthcare provider and local health department. Your local health authorities may give instructions on checking your symptoms and reporting information. When to seek emergency medical attention Look for  emergency warning signs* for COVID-19. If someone is showing any of these signs, seek emergency medical care immediately: Trouble breathing Persistent pain or pressure in the chest New confusion Inability to wake or stay awake Pale, gray, or blue-colored skin, lips, or nail beds, depending on skin tone *This list is not all possible symptoms. Please call your medical provider forany other symptoms that are severe or concerning to you. Call 911 or call ahead to your local emergency facility: Notify the operator that you are seeking care for someone who has or may haveCOVID-19. Call ahead before visiting your doctor Call ahead. Many medical visits for routine care are being postponed or done by phone or telemedicine. If you have a medical appointment that cannot be postponed, call your doctor's office, and tell them you have or may have COVID-19. This will help the office protect themselves and other patients. Get tested If you have symptoms of COVID-19, get tested. While waiting for test results, you stay away from others, including staying apart from those living in your household. Self-tests are one of several options for testing for the virus that causes COVID-19 and may be more convenient than laboratory-based tests and point-of-care tests. Ask your healthcare provider or your local health department if you need help interpreting your test results. You can visit your state, tribal, local, and territorial health department's website to look for the latest local information on testing sites. If you are sick, wear a mask over your nose and mouth You should wear a mask over your nose and mouth if you must be around other people or animals, including pets (even at home). You don't need to wear the mask if you are alone. If you can't put on a mask (because of trouble breathing, for example), cover your coughs and sneezes in some other way. Try to stay at least 6 feet away from other people. This will  help protect the people around you. Masks should not be placed on young children under age 24 years, anyone who has trouble breathing, or anyone who is not able to remove the mask without help. Note: During the COVID-19 pandemic, medical grade facemasks are reserved forhealthcare workers and some first responders. Cover your coughs and sneezes Cover your mouth and nose with a tissue when you cough or sneeze. Throw away used tissues in a lined trash can. Immediately wash your hands with soap and water for at least 20 seconds. If soap and water are not available, clean your hands with an alcohol-based hand sanitizer that contains at least 60% alcohol. Clean your hands often Wash your hands often with soap and water for at least 20 seconds. This is especially important after blowing your nose, coughing, or sneezing; going to the bathroom; and before eating or preparing food. Use hand sanitizer if soap and water are not available. Use an alcohol-based hand sanitizer with at least 60% alcohol, covering all surfaces of your hands and rubbing them together until they feel dry. Soap and water are the best option, especially if hands are visibly dirty. Avoid touching your eyes, nose, and mouth with unwashed hands. Handwashing Tips Avoid sharing  personal household items Do not share dishes, drinking glasses, cups, eating utensils, towels, or bedding with other people in your home. Wash these items thoroughly after using them with soap and water or put in the dishwasher. Clean all "high-touch" surfaces every day Clean and disinfect high-touch surfaces in your "sick room" and bathroom; wear disposable gloves. Let someone else clean and disinfect surfaces in common areas, but you should clean your bedroom and bathroom, if possible. If a caregiver or other person needs to clean and disinfect a sick person's bedroom or bathroom, they should do so on an as-needed basis. The caregiver/other person should wear a mask  and disposable gloves prior to cleaning. They should wait as long as possible after the person who is sick has used the bathroom before coming in to clean and use the bathroom. High-touch surfaces include phones, remote controls, counters, tabletops, doorknobs, bathroom fixtures, toilets, keyboards, tablets, and bedside tables. Clean and disinfect areas that may have blood, stool, or body fluids on them. Use household cleaners and disinfectants. Clean the area or item with soap and water or another detergent if it is dirty. Then, use a household disinfectant. Be sure to follow the instructions on the label to ensure safe and effective use of the product. Many products recommend keeping the surface wet for several minutes to ensure germs are killed. Many also recommend precautions such as wearing gloves and making sure you have good ventilation during use of the product. Use a product from [employer] List N: Disinfectants for Coronavirus (PPJKD-32). Complete Disinfection Guidance When you can be around others after being sick with COVID-19 Deciding when you can be around others is different for different situations. Find out when you can safely end home isolation. For any additional questions about your care,contact your healthcare provider or state or local health department. 01/14/2020 Content source: [facility] ([facility]), Division of Viral Diseases This information is not intended to replace advice given to you by your health care provider. Make sure you discuss any questions you have with your healthcare provider. Document Revised: 03/12/2020 Document Reviewed: 03/12/2020 Elsevier Patient Education  [place].   Can take to lessen severity: Vit C 585m twice daily Quercertin 2671-245YKtwice daily Zinc 75-1067mdaily Melatonin 3-6 mg at bedtime Vit D3 1000-2000 IU daily Aspirin 81 mg daily with food Optional: Famotidine 2064maily Also can  add tylenol/ibuprofen as needed for fevers and body aches May add Mucinex or Mucinex DM as needed for cough/congestion  10 Things You Can Do to Manage Your COVID-19 Symptoms at Home If you have possible or confirmed COVID-19 Stay home except to get medical care. Monitor your symptoms carefully. If your symptoms get worse, call your healthcare provider immediately. Get rest and stay hydrated. If you have a medical appointment, call the healthcare provider ahead of time and tell them that you have or may have COVID-19. For medical emergencies, call 911 and notify the dispatch personnel that you have or may have COVID-19. Cover your cough and sneezes with a tissue or use the inside of your elbow. Wash your hands often with soap and water for at least 20 seconds or clean your hands with an alcohol-based hand sanitizer that contains at least 60% alcohol. As much as possible, stay in a specific room and away from other people in your home. Also, you should use a separate bathroom, if available. If you need to be around other people in or outside of the home,  wear a mask. Avoid sharing personal items with other people in your household, like dishes, towels, and bedding. Clean all surfaces that are touched often, like counters, tabletops, and doorknobs. Use household cleaning sprays or wipes according to the label instructions. [email or web address] 08/22/2019 This information is not intended to replace advice given to you by your health care provider. Make sure you discuss any questions you have with your healthcare provider. Document Revised: 03/12/2020 Document Reviewed: 03/12/2020 Elsevier Patient Education  [place].

## 2020-09-16 NOTE — Progress Notes (Signed)
Virtual Visit Consent   Rachel Garrett, you are scheduled for a virtual visit with a [facility] provider today.     Just as with appointments in the office, your consent must be obtained to participate.  Your consent will be active for this visit and any virtual visit you may have with one of our providers in the next 365 days.     If you have a MyChart account, a copy of this consent can be sent to you electronically.  All virtual visits are billed to your insurance company just like a traditional visit in the office.    As this is a virtual visit, video technology does not allow for your provider to perform a traditional examination.  This may limit your provider's ability to fully assess your condition.  If your provider identifies any concerns that need to be evaluated in person or the need to arrange testing (such as labs, EKG, etc.), we will make arrangements to do so.     Although advances in technology are sophisticated, we cannot ensure that it will always work on either your end or our end.  If the connection with a video visit is poor, the visit may have to be switched to a telephone visit.  With either a video or telephone visit, we are not always able to ensure that we have a secure connection.     I need to obtain your verbal consent now.   Are you willing to proceed with your visit today?    Rachel Garrett has provided verbal consent on 09/16/2020 for a virtual visit (video or telephone).   Mar Daring, PA-C   Date: 09/16/2020 9:50 AM   Virtual Visit via Video Note   I, Mar Daring, connected with  IMA Garrett  ([id number], 10/25/1985) on 09/16/20 at  9:30 AM EDT by a video-enabled telemedicine application and verified that I am speaking with the correct person using two identifiers.  Location: Patient: parked safely in car Provider: Virtual Visit Location Provider: Home Office   I discussed the limitations of evaluation and management by  telemedicine and the availability of in person appointments. The patient expressed understanding and agreed to proceed.    History of Present Illness: Rachel Garrett is a 35 y.o. who identifies as a female who was assigned female at birth, and is being seen today for Covid 8.  HPI: URI  This is a new problem. The current episode started in the past 7 days (symptoms started on Tuesday, 09/14/20, tested positive for covid 19 yesterday 09/15/20). The problem has been gradually worsening. The maximum temperature recorded prior to her arrival was 100.4 - 100.9 F. The fever has been present for Less than 1 day. Associated symptoms include congestion, ear pain, headaches, joint pain, sinus pain and a sore throat (in the beginning but now improved). Pertinent negatives include no coughing, diarrhea, nausea, plugged ear sensation, rhinorrhea, sneezing, vomiting or wheezing. Associated symptoms comments: Dizzy this morning. She has tried decongestant, acetaminophen, sleep and increased fluids (dayquil, tylenol, Nyquil, Mucinex) for the symptoms. The treatment provided mild relief.     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   Carpal tunnel syndrome 09/16/2018   Endometriosis determined by laparoscopy 02/19/2017   S/P myomectomy 11/02/2016   Right ovarian cyst 08/07/2016    Allergies:  Allergies  Allergen Reactions   Chocolate Flavor Other (See Comments)    Causes migraine   Other Itching and Swelling    Allergic to bees   Bee Venom Swelling   Benadryl [Diphenhydramine Hcl (Sleep)] Swelling   Chocolate     migraines   Estrogens     Increases chances of having stroke   Lactose Intolerance (Gi) Diarrhea and Nausea And Vomiting   Latex Itching and Swelling   Ultram [Tramadol Hcl] Itching   Iodine Itching   Medications:  Current Outpatient  Medications:    albuterol (PROVENTIL) (2.5 MG/3ML) 0.083% nebulizer solution, Take 3 mLs (2.5 mg total) by nebulization every 6 (six) hours as needed for wheezing or shortness of breath., Disp: 150 mL, Rfl: 1   benzonatate (TESSALON) 100 MG capsule, Take 1 capsule (100 mg total) by mouth 3 (three) times daily as needed., Disp: 30 capsule, Rfl: 0   fluticasone (FLONASE) 50 MCG/ACT nasal spray, Place 2 sprays into both nostrils daily., Disp: 16 g, Rfl: 0   naproxen (NAPROSYN) 500 MG tablet, Take 1 tablet (500 mg total) by mouth 2 (two) times daily with a meal., Disp: 30 tablet, Rfl: 0   ondansetron (ZOFRAN) 4 MG tablet, Take 1 tablet (4 mg total) by mouth every 8 (eight) hours as needed for nausea or vomiting., Disp: 20 tablet, Rfl: 0   celecoxib (CELEBREX) 100 MG capsule, Take 100 mg by mouth 2 (two) times daily. (Patient not taking: Reported on 02/19/2020), Disp: , Rfl:    citalopram (CELEXA) 20 MG tablet, Take 1 tablet by mouth daily., Disp: , Rfl:    citalopram (CELEXA) 20 MG tablet, TAKE 1 & 1/2 TABLETS BY MOUTH DAILY, Disp: 135 tablet, Rfl: 2   citalopram (CELEXA) 40 MG tablet, TAKE 1 TABLET BY MOUTH ONCE A DAY FOR MOOD, Disp: 30 tablet, Rfl: 2   clindamycin (CLEOCIN T) 1 % external solution, APPLY TOPICALLY TO AFFECTED AREAS TWICE A DAY, Disp: 60 mL, Rfl: 5   COVID-19 At Home Antigen Test (CARESTART COVID-19 HOME TEST) KIT, use as directed, Disp: 2 kit, Rfl: 0   doxycycline (VIBRA-TABS) 100 MG tablet, TAKE 1 TABLET BY MOUTH TWICE DAILY WITH FOOD AND WATER. BE SURE TO SIT UPRIGHT FOR 30 MINUTES AFTER TAKING AND SUN PROTECTION WHILE ON MEDICATION, Disp: 60 tablet, Rfl: 2   Elagolix Sodium (ORILISSA) 150 MG TABS, Take 1 tablet by mouth daily., Disp: 30 tablet, Rfl: 5   Elagolix Sodium 150 MG TABS, TAKE 1 TABLET BY MOUTH ONCE DAILY, Disp: 30 tablet, Rfl: 5   Elagolix Sodium 150 MG TABS, TAKE 1 TABLET BY MOUTH ONCE DAILY, Disp: 28 tablet, Rfl: 5   FLUoxetine (PROZAC) 20 MG capsule, Take 1 capsule by  mouth once a day, Disp: 90 capsule, Rfl: 3   FLUoxetine (PROZAC) 40 MG capsule, Take 1 capsule by mouth once a day, Disp: 90 capsule, Rfl: 3   gabapentin (NEURONTIN) 300 MG capsule, Take 1 capsule by mouth 3 (three) times daily., Disp: , Rfl:    gabapentin (NEURONTIN) 300 MG capsule, TAKE 3 CAPSULES BY MOUTH BY MOUTH AT BEDTIME, Disp: 90 capsule, Rfl: 2   gabapentin (NEURONTIN) 300 MG capsule, Take 3 pills p.o. qHS, Disp: 90 capsule, Rfl: 5   gabapentin (NEURONTIN) 300 MG capsule, Take 1 capsule in the morning p.o., 1 capsule in the afternoon p.o. and 3 capsules in the evening before bedtime p.o., Disp: 150 capsule, Rfl: 0   hydrochlorothiazide (HYDRODIURIL) 12.5 MG tablet, Take 1 tablet (12.5 mg total) by  mouth daily., Disp: 30 tablet, Rfl: 0   hydrochlorothiazide (MICROZIDE) 12.5 MG capsule, TAKE 1 CAPSULE BY MOUTH ONCE A DAY, Disp: 90 capsule, Rfl: 2   hydrochlorothiazide (MICROZIDE) 12.5 MG capsule, TAKE 1 CAPSULE BY MOUTH ONCE DAILY FOR HIGH BLOOD PRESSURE, Disp: 30 capsule, Rfl: 0   methocarbamol (ROBAXIN) 500 MG tablet, , Disp: , Rfl:    methocarbamol (ROBAXIN) 500 MG tablet, TAKE 1 TO 2 TABLETS BY MOUTH 3 TIMES DAILY AS NEEDED FOR MUSCLE SPASM, Disp: 180 tablet, Rfl: 2   methocarbamol (ROBAXIN) 500 MG tablet, Take 1 to 2 pills p.o. 3 times daily as needed muscle spasm, Disp: 180 tablet, Rfl: 2   ondansetron (ZOFRAN ODT) 4 MG disintegrating tablet, Take 1 tablet (4 mg total) by mouth every 6 (six) hours as needed for nausea., Disp: 30 tablet, Rfl: 0   oxycodone (OXY-IR) 5 MG capsule, Take 5 mg by mouth every 4 (four) hours as needed., Disp: , Rfl:    oxyCODONE-acetaminophen (PERCOCET/ROXICET) 5-325 MG tablet, Take 1 tablet by mouth in the morning and at bedtime. (Patient not taking: Reported on 02/19/2020), Disp: , Rfl:    oxyCODONE-acetaminophen (PERCOCET/ROXICET) 5-325 MG tablet, TAKE 1 TABLET BY MOUTH 2 (TWO) TIMES DAILY AS NEEDED, Disp: 10 tablet, Rfl: 0   rizatriptan (MAXALT) 10 MG tablet,  Take 10 mg by mouth every 2 (two) hours as needed for migraine. , Disp: , Rfl: 0   topiramate (TOPAMAX) 100 MG tablet, Take 1 tablet (100 mg total) by mouth every morning., Disp: 30 tablet, Rfl: 0   topiramate (TOPAMAX) 50 MG tablet, TAKE 1 TABLET BY MOUTH TWICE A DAY, Disp: 180 tablet, Rfl: 2   topiramate (TOPAMAX) 50 MG tablet, TAKE 1 TABLET BY MOUTH TWICE A DAY, Disp: 180 tablet, Rfl: 0   traZODone (DESYREL) 50 MG tablet, TAKE 1 TABLET BY MOUTH AT BEDTIME AS NEEDED FOR SLEEP, Disp: 30 tablet, Rfl: 4   valACYclovir (VALTREX) 1000 MG tablet, TAKE 1 TABLET BY MOUTH ONCE DAILY, Disp: 90 tablet, Rfl: 3   varenicline (CHANTIX) 0.5 MG tablet, Take 1 tablet (0.17m) once daily for 3 days, then 1 tablet (0.559m twice a day for 4 days, the 2 tablets (102m68mtwice a day. Then start 102mg69mblet twice daily., Disp: 56 tablet, Rfl: 0   varenicline (CHANTIX) 1 MG tablet, TAKE 1/2 TABLET BY MOUTH ONCE DAILY FOR 3 DAYS; TAKE 1/2 TABLET 2 TIMES DAILY FOR 4 DAYS; TAKE 1 TABLET 2 TIMES DAILY THEREAFTER, Disp: 56 tablet, Rfl: 0   varenicline (CHANTIX) 1 MG tablet, Take 1 tablet by mouth twice a day, Disp: 120 tablet, Rfl: 0  Observations/Objective: Patient is well-developed, well-nourished in no acute distress.  Resting comfortably in car.  Head is normocephalic, atraumatic.  No labored breathing.  Speech is clear and coherent with logical content.  Patient is alert and oriented at baseline.    Assessment and Plan: 1. COVID-19 - naproxen (NAPROSYN) 500 MG tablet; Take 1 tablet (500 mg total) by mouth 2 (two) times daily with a meal.  Dispense: 30 tablet; Refill: 0 - benzonatate (TESSALON) 100 MG capsule; Take 1 capsule (100 mg total) by mouth 3 (three) times daily as needed.  Dispense: 30 capsule; Refill: 0 - albuterol (PROVENTIL) (2.5 MG/3ML) 0.083% nebulizer solution; Take 3 mLs (2.5 mg total) by nebulization every 6 (six) hours as needed for wheezing or shortness of breath.  Dispense: 150 mL; Refill: 1 -  fluticasone (FLONASE) 50 MCG/ACT nasal spray; Place 2 sprays into both nostrils daily.  Dispense: 16 g; Refill: 0 - ondansetron (ZOFRAN) 4 MG tablet; Take 1 tablet (4 mg total) by mouth every 8 (eight) hours as needed for nausea or vomiting.  Dispense: 20 tablet; Refill: 0 - Continue OTC symptomatic management of choice - Will send OTC vitamins and supplement information through [facility] - Albuterol, tessalon, flonase, naproxen and zofran prescribed for symptomatic management - Patient enrolled in MyChart symptom monitoring - Push fluids - Rest as needed - Discussed return precautions and when to seek in-person evaluation, sent via AVS as well  Follow Up Instructions: I discussed the assessment and treatment plan with the patient. The patient was provided an opportunity to ask questions and all were answered. The patient agreed with the plan and demonstrated an understanding of the instructions.  A copy of instructions were sent to the patient via MyChart.  The patient was advised to call back or seek an in-person evaluation if the symptoms worsen or if the condition fails to improve as anticipated.  Time:  I spent 12 minutes with the patient via telehealth technology discussing the above problems/concerns.    Mar Daring, PA-C

## 2020-09-23 ENCOUNTER — Telehealth: Payer: 59 | Admitting: Physician Assistant

## 2020-09-23 DIAGNOSIS — U099 Post covid-19 condition, unspecified: Secondary | ICD-10-CM | POA: Diagnosis not present

## 2020-09-23 NOTE — Patient Instructions (Signed)
Rachel Garrett, thank you for joining Leeanne Rio, PA-C for today's virtual visit.  While this provider is not your primary care provider (PCP), if your PCP is located in our provider database this encounter information will be shared with them immediately following your visit.  Consent: (Patient) Rachel Garrett provided verbal consent for this virtual visit at the beginning of the encounter.  Current Medications:  Current Outpatient Medications:    albuterol (PROVENTIL) (2.5 MG/3ML) 0.083% nebulizer solution, Take 3 mLs (2.5 mg total) by nebulization every 6 (six) hours as needed for wheezing or shortness of breath., Disp: 150 mL, Rfl: 1   benzonatate (TESSALON) 100 MG capsule, Take 1 capsule (100 mg total) by mouth 3 (three) times daily as needed., Disp: 30 capsule, Rfl: 0   celecoxib (CELEBREX) 100 MG capsule, Take 100 mg by mouth 2 (two) times daily. (Patient not taking: Reported on 02/19/2020), Disp: , Rfl:    citalopram (CELEXA) 20 MG tablet, Take 1 tablet by mouth daily., Disp: , Rfl:    citalopram (CELEXA) 20 MG tablet, TAKE 1 & 1/2 TABLETS BY MOUTH DAILY, Disp: 135 tablet, Rfl: 2   citalopram (CELEXA) 40 MG tablet, TAKE 1 TABLET BY MOUTH ONCE A DAY FOR MOOD, Disp: 30 tablet, Rfl: 2   clindamycin (CLEOCIN T) 1 % external solution, APPLY TOPICALLY TO AFFECTED AREAS TWICE A DAY, Disp: 60 mL, Rfl: 5   COVID-19 At Home Antigen Test (CARESTART COVID-19 HOME TEST) KIT, use as directed, Disp: 2 kit, Rfl: 0   doxycycline (VIBRA-TABS) 100 MG tablet, TAKE 1 TABLET BY MOUTH TWICE DAILY WITH FOOD AND WATER. BE SURE TO SIT UPRIGHT FOR 30 MINUTES AFTER TAKING AND SUN PROTECTION WHILE ON MEDICATION, Disp: 60 tablet, Rfl: 2   Elagolix Sodium (ORILISSA) 150 MG TABS, Take 1 tablet by mouth daily., Disp: 30 tablet, Rfl: 5   Elagolix Sodium 150 MG TABS, TAKE 1 TABLET BY MOUTH ONCE DAILY, Disp: 30 tablet, Rfl: 5   Elagolix Sodium 150 MG TABS, TAKE 1 TABLET BY MOUTH ONCE DAILY, Disp: 28 tablet,  Rfl: 5   FLUoxetine (PROZAC) 20 MG capsule, Take 1 capsule by mouth once a day, Disp: 90 capsule, Rfl: 3   FLUoxetine (PROZAC) 40 MG capsule, Take 1 capsule by mouth once a day, Disp: 90 capsule, Rfl: 3   fluticasone (FLONASE) 50 MCG/ACT nasal spray, Place 2 sprays into both nostrils daily., Disp: 16 g, Rfl: 0   gabapentin (NEURONTIN) 300 MG capsule, Take 1 capsule by mouth 3 (three) times daily., Disp: , Rfl:    gabapentin (NEURONTIN) 300 MG capsule, TAKE 3 CAPSULES BY MOUTH BY MOUTH AT BEDTIME, Disp: 90 capsule, Rfl: 2   gabapentin (NEURONTIN) 300 MG capsule, Take 3 pills p.o. qHS, Disp: 90 capsule, Rfl: 5   gabapentin (NEURONTIN) 300 MG capsule, Take 1 capsule in the morning p.o., 1 capsule in the afternoon p.o. and 3 capsules in the evening before bedtime p.o., Disp: 150 capsule, Rfl: 0   hydrochlorothiazide (HYDRODIURIL) 12.5 MG tablet, Take 1 tablet (12.5 mg total) by mouth daily., Disp: 30 tablet, Rfl: 0   hydrochlorothiazide (MICROZIDE) 12.5 MG capsule, TAKE 1 CAPSULE BY MOUTH ONCE A DAY, Disp: 90 capsule, Rfl: 2   hydrochlorothiazide (MICROZIDE) 12.5 MG capsule, TAKE 1 CAPSULE BY MOUTH ONCE DAILY FOR HIGH BLOOD PRESSURE, Disp: 30 capsule, Rfl: 0   methocarbamol (ROBAXIN) 500 MG tablet, , Disp: , Rfl:    methocarbamol (ROBAXIN) 500 MG tablet, TAKE 1 TO 2 TABLETS BY MOUTH  3 TIMES DAILY AS NEEDED FOR MUSCLE SPASM, Disp: 180 tablet, Rfl: 2   methocarbamol (ROBAXIN) 500 MG tablet, Take 1 to 2 pills p.o. 3 times daily as needed muscle spasm, Disp: 180 tablet, Rfl: 2   naproxen (NAPROSYN) 500 MG tablet, Take 1 tablet (500 mg total) by mouth 2 (two) times daily with a meal., Disp: 30 tablet, Rfl: 0   ondansetron (ZOFRAN ODT) 4 MG disintegrating tablet, Take 1 tablet (4 mg total) by mouth every 6 (six) hours as needed for nausea., Disp: 30 tablet, Rfl: 0   ondansetron (ZOFRAN) 4 MG tablet, Take 1 tablet (4 mg total) by mouth every 8 (eight) hours as needed for nausea or vomiting., Disp: 20 tablet,  Rfl: 0   oxycodone (OXY-IR) 5 MG capsule, Take 5 mg by mouth every 4 (four) hours as needed., Disp: , Rfl:    oxyCODONE-acetaminophen (PERCOCET/ROXICET) 5-325 MG tablet, Take 1 tablet by mouth in the morning and at bedtime. (Patient not taking: Reported on 02/19/2020), Disp: , Rfl:    oxyCODONE-acetaminophen (PERCOCET/ROXICET) 5-325 MG tablet, TAKE 1 TABLET BY MOUTH 2 (TWO) TIMES DAILY AS NEEDED, Disp: 10 tablet, Rfl: 0   rizatriptan (MAXALT) 10 MG tablet, Take 10 mg by mouth every 2 (two) hours as needed for migraine. , Disp: , Rfl: 0   topiramate (TOPAMAX) 100 MG tablet, Take 1 tablet (100 mg total) by mouth every morning., Disp: 30 tablet, Rfl: 0   topiramate (TOPAMAX) 50 MG tablet, TAKE 1 TABLET BY MOUTH TWICE A DAY, Disp: 180 tablet, Rfl: 2   topiramate (TOPAMAX) 50 MG tablet, TAKE 1 TABLET BY MOUTH TWICE A DAY, Disp: 180 tablet, Rfl: 0   traZODone (DESYREL) 50 MG tablet, TAKE 1 TABLET BY MOUTH AT BEDTIME AS NEEDED FOR SLEEP, Disp: 30 tablet, Rfl: 4   valACYclovir (VALTREX) 1000 MG tablet, TAKE 1 TABLET BY MOUTH ONCE DAILY, Disp: 90 tablet, Rfl: 3   varenicline (CHANTIX) 0.5 MG tablet, Take 1 tablet (0.$RemoveBef'5mg'qHQXpxIZBt$ ) once daily for 3 days, then 1 tablet (0.$RemoveBef'5mg'tFQFlNguxO$ ) twice a day for 4 days, the 2 tablets ($RemoveBe'1mg'RJsXFLJGP$ ) twice a day. Then start $RemoveBe'1mg'izPFGsuZh$  tablet twice daily., Disp: 56 tablet, Rfl: 0   varenicline (CHANTIX) 1 MG tablet, TAKE 1/2 TABLET BY MOUTH ONCE DAILY FOR 3 DAYS; TAKE 1/2 TABLET 2 TIMES DAILY FOR 4 DAYS; TAKE 1 TABLET 2 TIMES DAILY THEREAFTER, Disp: 56 tablet, Rfl: 0   varenicline (CHANTIX) 1 MG tablet, Take 1 tablet by mouth twice a day, Disp: 120 tablet, Rfl: 0   Medications ordered in this encounter:  No orders of the defined types were placed in this encounter.    *If you need refills on other medications prior to your next appointment, please contact your pharmacy*  Follow-Up: Call back or seek an in-person evaluation if the symptoms worsen or if the condition fails to improve as  anticipated.  Other Instructions Please continue to keep well-hydrated Follow the dietary recommendations below. You can use Immodium if needed. I do recommend that you start a daily probiotic to help aid digestion and further calm some of these symptoms down. As dicussed, I do want you to call your PCP office to discuss fatigue, etc giving your history of anemia as some lab work needs to be rechecked.  If you note any acute worsening of symptoms, you need in-person evaluation at Urgent Care or ER ASAP. Do not delay care!   If you have been instructed to have an in-person evaluation today at a local Urgent Care  facility, please use the link below. It will take you to a list of all of our available [facility], including address, phone number and hours of operation. Please do not delay care.  [facility]  If you or a family member do not have a primary care provider, use the link below to schedule a visit and establish care. When you choose a [facility] primary care physician or advanced practice provider, you gain a long-term partner in health. Find a Primary Care Provider  Learn more about [facility] in-office and virtual care options: [facility] - Get Care Now  

## 2020-09-23 NOTE — Progress Notes (Signed)
Virtual Visit Consent   Rachel Garrett, you are scheduled for a virtual visit with a [facility] provider today.     Just as with appointments in the office, your consent must be obtained to participate.  Your consent will be active for this visit and any virtual visit you may have with one of our providers in the next 365 days.     If you have a MyChart account, a copy of this consent can be sent to you electronically.  All virtual visits are billed to your insurance company just like a traditional visit in the office.    As this is a virtual visit, video technology does not allow for your provider to perform a traditional examination.  This may limit your provider's ability to fully assess your condition.  If your provider identifies any concerns that need to be evaluated in person or the need to arrange testing (such as labs, EKG, etc.), we will make arrangements to do so.     Although advances in technology are sophisticated, we cannot ensure that it will always work on either your end or our end.  If the connection with a video visit is poor, the visit may have to be switched to a telephone visit.  With either a video or telephone visit, we are not always able to ensure that we have a secure connection.     I need to obtain your verbal consent now.   Are you willing to proceed with your visit today?    Rachel Garrett has provided verbal consent on 09/23/2020 for a virtual visit (video or telephone).   Leeanne Rio, [place]   Date: 09/23/2020 9:24 AM   Virtual Visit via Video Note   I, Leeanne Rio, connected with  Rachel Garrett  ([id number], 1986/01/07) on 09/23/20 at  9:15 AM EDT by a video-enabled telemedicine application and verified that I am speaking with the correct person using two identifiers.  Location: Patient: Virtual Visit Location Patient: Home Provider: Virtual Visit Location Provider: Home Office   I discussed the limitations of evaluation and  management by telemedicine and the availability of in person appointments. The patient expressed understanding and agreed to proceed.    History of Present Illness: Rachel Garrett is a 35 y.o. who identifies as a female who was assigned female at birth, and is being seen today for residual symptoms after being diagnosed with COVID a little over a week ago. Patient notes that her initial COVID symptoms have resolved -- no lingering fever, aches, URI symptoms. She notes she still has occasional nausea with decreased appetite. Denies true diarrhea but notes if she eats some solid food she will have to go to the bathroom and it is loose. Denies melena, hematochezia or tenesmus. Still noting moderate fatigue. Denies chest pain or SOB. Denies lightheadedness but notes occasionally feeling a little "off balance" if she exerts herself. No overt vertigo. She notes hydrating well with water and pedialyte, having good urinary output. Has history of anemia per chart review. Has not reached out to her primary care provider as of yet.   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   Carpal tunnel syndrome 09/16/2018   Endometriosis determined by laparoscopy 02/19/2017   S/P myomectomy 11/02/2016   Right ovarian cyst 08/07/2016    Allergies:  Allergies  Allergen Reactions   Chocolate Flavor Other (See Comments)    Causes migraine   Other Itching and Swelling    Allergic to bees   Bee Venom Swelling   Benadryl [Diphenhydramine Hcl (Sleep)] Swelling   Chocolate     migraines   Estrogens     Increases chances of having stroke   Lactose Intolerance (Gi) Diarrhea and Nausea And Vomiting   Latex Itching and Swelling   Ultram [Tramadol Hcl] Itching   Iodine Itching   Medications:  Current Outpatient Medications:    albuterol (PROVENTIL) (2.5  MG/3ML) 0.083% nebulizer solution, Take 3 mLs (2.5 mg total) by nebulization every 6 (six) hours as needed for wheezing or shortness of breath., Disp: 150 mL, Rfl: 1   benzonatate (TESSALON) 100 MG capsule, Take 1 capsule (100 mg total) by mouth 3 (three) times daily as needed., Disp: 30 capsule, Rfl: 0   celecoxib (CELEBREX) 100 MG capsule, Take 100 mg by mouth 2 (two) times daily. (Patient not taking: Reported on 02/19/2020), Disp: , Rfl:    citalopram (CELEXA) 20 MG tablet, Take 1 tablet by mouth daily., Disp: , Rfl:    citalopram (CELEXA) 20 MG tablet, TAKE 1 & 1/2 TABLETS BY MOUTH DAILY, Disp: 135 tablet, Rfl: 2   citalopram (CELEXA) 40 MG tablet, TAKE 1 TABLET BY MOUTH ONCE A DAY FOR MOOD, Disp: 30 tablet, Rfl: 2   clindamycin (CLEOCIN T) 1 % external solution, APPLY TOPICALLY TO AFFECTED AREAS TWICE A DAY, Disp: 60 mL, Rfl: 5   COVID-19 At Home Antigen Test (CARESTART COVID-19 HOME TEST) KIT, use as directed, Disp: 2 kit, Rfl: 0   doxycycline (VIBRA-TABS) 100 MG tablet, TAKE 1 TABLET BY MOUTH TWICE DAILY WITH FOOD AND WATER. BE SURE TO SIT UPRIGHT FOR 30 MINUTES AFTER TAKING AND SUN PROTECTION WHILE ON MEDICATION, Disp: 60 tablet, Rfl: 2   Elagolix Sodium (ORILISSA) 150 MG TABS, Take 1 tablet by mouth daily., Disp: 30 tablet, Rfl: 5   Elagolix Sodium 150 MG TABS, TAKE 1 TABLET BY MOUTH ONCE DAILY, Disp: 30 tablet, Rfl: 5   Elagolix Sodium 150 MG TABS, TAKE 1 TABLET BY MOUTH ONCE DAILY, Disp: 28 tablet, Rfl: 5   FLUoxetine (PROZAC) 20 MG capsule, Take 1 capsule by mouth once a day, Disp: 90 capsule, Rfl: 3   FLUoxetine (PROZAC) 40 MG capsule, Take 1 capsule by mouth once a day, Disp: 90 capsule, Rfl: 3   fluticasone (FLONASE) 50 MCG/ACT nasal spray, Place 2 sprays into both nostrils daily., Disp: 16 g, Rfl: 0   gabapentin (NEURONTIN) 300 MG capsule, Take 1 capsule by mouth 3 (three) times daily., Disp: , Rfl:    gabapentin (NEURONTIN) 300 MG capsule, TAKE 3 CAPSULES BY MOUTH BY MOUTH AT BEDTIME,  Disp: 90 capsule, Rfl: 2   gabapentin (NEURONTIN) 300 MG capsule, Take 3 pills p.o. qHS, Disp: 90 capsule, Rfl: 5   gabapentin (NEURONTIN) 300 MG capsule, Take 1 capsule in the morning p.o., 1 capsule in the afternoon p.o. and 3 capsules in the evening before bedtime p.o., Disp: 150 capsule, Rfl: 0   hydrochlorothiazide (HYDRODIURIL) 12.5 MG tablet, Take 1 tablet (12.5 mg total) by mouth daily., Disp: 30 tablet, Rfl: 0   hydrochlorothiazide (MICROZIDE) 12.5 MG capsule, TAKE 1 CAPSULE BY MOUTH ONCE A DAY, Disp: 90 capsule, Rfl: 2   hydrochlorothiazide (MICROZIDE) 12.5 MG capsule, TAKE 1 CAPSULE BY MOUTH ONCE DAILY FOR HIGH BLOOD PRESSURE, Disp: 30 capsule, Rfl: 0   methocarbamol (ROBAXIN) 500  MG tablet, , Disp: , Rfl:    methocarbamol (ROBAXIN) 500 MG tablet, TAKE 1 TO 2 TABLETS BY MOUTH 3 TIMES DAILY AS NEEDED FOR MUSCLE SPASM, Disp: 180 tablet, Rfl: 2   methocarbamol (ROBAXIN) 500 MG tablet, Take 1 to 2 pills p.o. 3 times daily as needed muscle spasm, Disp: 180 tablet, Rfl: 2   naproxen (NAPROSYN) 500 MG tablet, Take 1 tablet (500 mg total) by mouth 2 (two) times daily with a meal., Disp: 30 tablet, Rfl: 0   ondansetron (ZOFRAN ODT) 4 MG disintegrating tablet, Take 1 tablet (4 mg total) by mouth every 6 (six) hours as needed for nausea., Disp: 30 tablet, Rfl: 0   ondansetron (ZOFRAN) 4 MG tablet, Take 1 tablet (4 mg total) by mouth every 8 (eight) hours as needed for nausea or vomiting., Disp: 20 tablet, Rfl: 0   oxycodone (OXY-IR) 5 MG capsule, Take 5 mg by mouth every 4 (four) hours as needed., Disp: , Rfl:    oxyCODONE-acetaminophen (PERCOCET/ROXICET) 5-325 MG tablet, Take 1 tablet by mouth in the morning and at bedtime. (Patient not taking: Reported on 02/19/2020), Disp: , Rfl:    oxyCODONE-acetaminophen (PERCOCET/ROXICET) 5-325 MG tablet, TAKE 1 TABLET BY MOUTH 2 (TWO) TIMES DAILY AS NEEDED, Disp: 10 tablet, Rfl: 0   rizatriptan (MAXALT) 10 MG tablet, Take 10 mg by mouth every 2 (two) hours as  needed for migraine. , Disp: , Rfl: 0   topiramate (TOPAMAX) 100 MG tablet, Take 1 tablet (100 mg total) by mouth every morning., Disp: 30 tablet, Rfl: 0   topiramate (TOPAMAX) 50 MG tablet, TAKE 1 TABLET BY MOUTH TWICE A DAY, Disp: 180 tablet, Rfl: 2   topiramate (TOPAMAX) 50 MG tablet, TAKE 1 TABLET BY MOUTH TWICE A DAY, Disp: 180 tablet, Rfl: 0   traZODone (DESYREL) 50 MG tablet, TAKE 1 TABLET BY MOUTH AT BEDTIME AS NEEDED FOR SLEEP, Disp: 30 tablet, Rfl: 4   valACYclovir (VALTREX) 1000 MG tablet, TAKE 1 TABLET BY MOUTH ONCE DAILY, Disp: 90 tablet, Rfl: 3   varenicline (CHANTIX) 0.5 MG tablet, Take 1 tablet (0.102m) once daily for 3 days, then 1 tablet (0.562m twice a day for 4 days, the 2 tablets (94m64mtwice a day. Then start 94mg64mblet twice daily., Disp: 56 tablet, Rfl: 0   varenicline (CHANTIX) 1 MG tablet, TAKE 1/2 TABLET BY MOUTH ONCE DAILY FOR 3 DAYS; TAKE 1/2 TABLET 2 TIMES DAILY FOR 4 DAYS; TAKE 1 TABLET 2 TIMES DAILY THEREAFTER, Disp: 56 tablet, Rfl: 0   varenicline (CHANTIX) 1 MG tablet, Take 1 tablet by mouth twice a day, Disp: 120 tablet, Rfl: 0  Observations/Objective: Patient is well-developed, well-nourished in no acute distress.  Resting comfortably at home.  Head is normocephalic, atraumatic.  No labored breathing. Speech is clear and coherent with logical content.  Patient is alert and oriented at baseline.   Assessment and Plan: 1. Post covid-19 condition, unspecified Still recuperating from infection. Thankfully all of her initial symptoms have resolved. Discussed can take some time for energy and appetite to return. She has not been eating much so hard to solidify her stool. BRAT diet reviewed. Continue zofran as needed. Can use Immodium if she notes increased frequency of BM. Discussed giving history of anemia and her current fatigue and occasional "feeling off-balance", I want her to contact her PCP for further evaluation including assessment of hgb, renal function and  electrolytes just to be cautious. She agrees to call and schedule follow-up.  Follow Up Instructions:  I discussed the assessment and treatment plan with the patient. The patient was provided an opportunity to ask questions and all were answered. The patient agreed with the plan and demonstrated an understanding of the instructions.  A copy of instructions were sent to the patient via MyChart.  The patient was advised to call back or seek an in-person evaluation if the symptoms worsen or if the condition fails to improve as anticipated.  Time:  I spent 14 minutes with the patient via telehealth technology discussing the above problems/concerns.    Leeanne Rio, PA-C

## 2020-09-24 ENCOUNTER — Other Ambulatory Visit: Payer: Self-pay

## 2020-09-28 ENCOUNTER — Other Ambulatory Visit: Payer: Self-pay

## 2020-09-28 MED ORDER — CLINDAMYCIN PHOSPHATE 1 % EX SOLN
CUTANEOUS | 5 refills | Status: AC
  Filled 2020-09-28: qty 60, 30d supply, fill #0
  Filled 2020-11-11: qty 60, 20d supply, fill #0
  Filled 2020-12-22: qty 60, 20d supply, fill #1

## 2020-09-28 MED FILL — Trazodone HCl Tab 50 MG: ORAL | 30 days supply | Qty: 30 | Fill #0 | Status: CN

## 2020-10-10 ENCOUNTER — Other Ambulatory Visit: Payer: Self-pay | Admitting: Oncology

## 2020-10-10 DIAGNOSIS — U071 COVID-19: Secondary | ICD-10-CM

## 2020-10-12 ENCOUNTER — Other Ambulatory Visit: Payer: Self-pay

## 2020-11-11 ENCOUNTER — Other Ambulatory Visit: Payer: Self-pay

## 2020-11-11 MED FILL — Trazodone HCl Tab 50 MG: ORAL | 30 days supply | Qty: 30 | Fill #0 | Status: AC

## 2020-11-12 ENCOUNTER — Other Ambulatory Visit: Payer: Self-pay | Admitting: Obstetrics and Gynecology

## 2020-11-12 ENCOUNTER — Other Ambulatory Visit: Payer: Self-pay

## 2020-11-12 MED FILL — Valacyclovir HCl Tab 1 GM: ORAL | 90 days supply | Qty: 90 | Fill #0 | Status: AC

## 2020-11-15 ENCOUNTER — Other Ambulatory Visit: Payer: Self-pay

## 2020-11-16 ENCOUNTER — Other Ambulatory Visit: Payer: Self-pay

## 2020-11-16 MED ORDER — HYDROCHLOROTHIAZIDE 12.5 MG PO CAPS
ORAL_CAPSULE | Freq: Every day | ORAL | 0 refills | Status: DC
  Filled 2020-11-16: qty 90, 90d supply, fill #0

## 2020-11-18 ENCOUNTER — Other Ambulatory Visit (HOSPITAL_COMMUNITY)
Admission: RE | Admit: 2020-11-18 | Discharge: 2020-11-18 | Disposition: A | Discharge status: Home / Self Care | Payer: 59 | Source: Ambulatory Visit | Attending: Obstetrics and Gynecology | Admitting: Obstetrics and Gynecology

## 2020-11-18 ENCOUNTER — Encounter: Payer: Self-pay | Admitting: Obstetrics and Gynecology

## 2020-11-18 ENCOUNTER — Other Ambulatory Visit: Payer: Self-pay

## 2020-11-18 ENCOUNTER — Ambulatory Visit (INDEPENDENT_AMBULATORY_CARE_PROVIDER_SITE_OTHER): Payer: 59 | Admitting: Obstetrics and Gynecology

## 2020-11-18 VITALS — BP 112/62 | HR 83 | Ht 67.5 in | Wt 176.0 lb

## 2020-11-18 DIAGNOSIS — Z808 Family history of malignant neoplasm of other organs or systems: Secondary | ICD-10-CM | POA: Diagnosis not present

## 2020-11-18 DIAGNOSIS — Z113 Encounter for screening for infections with a predominantly sexual mode of transmission: Secondary | ICD-10-CM | POA: Insufficient documentation

## 2020-11-18 DIAGNOSIS — K769 Liver disease, unspecified: Secondary | ICD-10-CM

## 2020-11-18 DIAGNOSIS — Z1239 Encounter for other screening for malignant neoplasm of breast: Secondary | ICD-10-CM

## 2020-11-18 DIAGNOSIS — Z01419 Encounter for gynecological examination (general) (routine) without abnormal findings: Secondary | ICD-10-CM | POA: Diagnosis not present

## 2020-11-18 DIAGNOSIS — Z803 Family history of malignant neoplasm of breast: Secondary | ICD-10-CM

## 2020-11-18 DIAGNOSIS — Z801 Family history of malignant neoplasm of trachea, bronchus and lung: Secondary | ICD-10-CM | POA: Diagnosis not present

## 2020-11-18 NOTE — Progress Notes (Signed)
Gynecology Annual Exam   PCP: [facility]  Chief Complaint:  Chief Complaint  Patient presents with   Gynecologic Exam    Annual - RM 5    History of Present Illness: Patient is a 35 y.o. G2P0020 presents for annual exam. The patient has no complaints today.   LMP: Patient's last menstrual period was 11/05/2020 (approximate). No menstrual complaints does till experience dysmenorrhea.  The patient is sexually active. She currently uses none for contraception. She denies dyspareunia.  The patient does perform self breast exams.  There is no notable family history of breast or ovarian cancer in her family.  The patient wears seatbelts: yes.   The patient has regular exercise: not asked.    The patient denies current symptoms of depression.    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   Carpal tunnel syndrome 09/16/2018   Endometriosis determined by laparoscopy 02/19/2017   S/P myomectomy 11/02/2016   Right ovarian cyst 08/07/2016    Past Surgical History:  Past Surgical History:  Procedure Laterality Date   CHROMOPERTUBATION N/A 02/27/2019   Procedure: CHROMOPERTUBATION;  Surgeon: Malachy Mood, MD;  Location: [facility];  Service: Gynecology;  Laterality: N/A;   HYSTEROSCOPY N/A 09/04/2017   Procedure: HYSTEROSCOPY;  Surgeon: Malachy Mood, MD;  Location: [facility];  Service: Gynecology;  Laterality: N/A;   LAPAROSCOPIC OVARIAN CYSTECTOMY N/A 02/27/2019   Procedure: LAPAROSCOPIC OVARIAN CYSTECTOMY;  Surgeon: Malachy Mood, MD;  Location: [facility];  Service: Gynecology;  Laterality: N/A;   LAPAROSCOPY N/A 09/04/2017   Procedure: LAPAROSCOPY DIAGNOSTIC;  Surgeon: Malachy Mood, MD;  Location: [facility];  Service: Gynecology;  Laterality: N/A;   LAPAROTOMY  N/A 11/02/2016   Procedure: LAPAROTOMY;  Surgeon: Malachy Mood, MD;  Location: [facility];  Service: Gynecology;  Laterality: N/A;   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 Contraception: none Last Pap: Results were: 11/17/2020NIL and HR HPV negative   Obstetric History: G2P0020  Family History:  Family History  Problem Relation Age of Onset   Breast cancer Maternal Aunt        Great Aunt   Breast cancer Paternal Aunt 74   Cervical cancer Maternal Aunt    Breast cancer Paternal 32        Twin half sisters of dad    Social History:  Social History   Socioeconomic History   Marital status: Single    Spouse name: Not on file   Number of children: Not on file   Years of education: Not on file   Highest education level: Not on file  Occupational History   Not on file  Tobacco Use   Smoking status: Every Day    Packs/day: 0.25    Years: 10.00    Pack years: 2.50    Types: Cigarettes   Smokeless tobacco: Never  Vaping Use   Vaping Use: Never used  Substance  and Sexual Activity   Alcohol use: Yes    Alcohol/week: 2.0 standard drinks    Types: 2 Cans of beer per week    Comment: weekend   Drug use: Not Currently    Comment: PT DENIES BUT HAS A + UDS IN 2017 FOR MARIJUANA   Sexual activity: Yes    Birth control/protection: None  Other Topics Concern   Not on file  Social History Narrative   Not on file   Social Determinants of Health   Financial Resource Strain: Not on file  Food Insecurity: Not on file  Transportation Needs: Not on file   Physical Activity: Not on file  Stress: Not on file  Social Connections: Not on file  Intimate Partner Violence: Not on file    Allergies:  Allergies  Allergen Reactions   Chocolate Flavor Other (See Comments)    Causes migraine   Other Itching and Swelling    Allergic to bees   Bee Venom Swelling   Benadryl [Diphenhydramine Hcl (Sleep)] Swelling   Chocolate     migraines   Estrogens     Increases chances of having stroke   Lactose Intolerance (Gi) Diarrhea and Nausea And Vomiting   Latex Itching and Swelling   Ultram [Tramadol Hcl] Itching   Iodine Itching    Medications: Prior to Admission medications   Medication Sig Start Date End Date Taking? Authorizing Provider  FLUoxetine (PROZAC) 40 MG capsule Take 1 capsule by mouth once a day 07/19/20  Yes   gabapentin (NEURONTIN) 300 MG capsule Take 1 capsule by mouth 3 (three) times daily. 06/20/19  Yes [provider]  hydrochlorothiazide (MICROZIDE) 12.5 MG capsule TAKE 1 CAPSULE BY MOUTH ONCE DAILY FOR HIGH BLOOD PRESSURE 12/10/19 12/09/20 Yes Revelo, Elyse Jarvis, MD  methocarbamol (ROBAXIN) 500 MG tablet  06/17/19  Yes [provider]  rizatriptan (MAXALT) 10 MG tablet Take 10 mg by mouth every 2 (two) hours as needed for migraine.  04/06/17  Yes [provider]  traZODone (DESYREL) 50 MG tablet TAKE 1 TABLET BY MOUTH AT BEDTIME AS NEEDED FOR SLEEP 02/20/20 02/19/21 Yes Malachy Mood, MD  valACYclovir (VALTREX) 1000 MG tablet TAKE 1 TABLET BY MOUTH ONCE DAILY 11/12/20  Yes Malachy Mood, MD  varenicline (CHANTIX) 0.5 MG tablet Take 1 tablet (0.36m) once daily for 3 days, then 1 tablet (0.565m twice a day for 4 days, the 2 tablets (65m3mtwice a day. Then start 65mg35mblet twice daily. 05/31/20  Yes   albuterol (PROVENTIL) (2.5 MG/3ML) 0.083% nebulizer solution Take 3 mLs (2.5 mg total) by nebulization every 6 (six) hours as needed for wheezing or shortness of breath. 09/16/20   BurnMar Daring-C   clindamycin (CLEOCIN T) 1 % external solution APPLY TOPICALLY TO AFFECTED AREAS TWICE A DAY 09/28/20     Elagolix Sodium 150 MG TABS TAKE 1 TABLET BY MOUTH ONCE DAILY 02/20/20 02/19/21  StaeMalachy Mood  Elagolix Sodium 150 MG TABS TAKE 1 TABLET BY MOUTH ONCE DAILY 12/29/19 12/28/20  StaeMalachy Mood  FLUoxetine (PROZAC) 20 MG capsule Take 1 capsule by mouth once a day 05/31/20     fluticasone (FLONASE) 50 MCG/ACT nasal spray Place 2 sprays into both nostrils daily. Patient not taking: Reported on 11/18/2020 09/16/20   BurnMar Daring-C  gabapentin (NEURONTIN) 300 MG capsule TAKE 3 CAPSULES BY MOUTH BY MOUTH AT BEDTIME 09/12/19 09/11/20  MoraGirtha HakeMD  gabapentin (NEURONTIN) 300 MG capsule Take 3 pills p.o. qHS 06/28/20  gabapentin (NEURONTIN) 300 MG capsule Take 1 capsule in the morning p.o., 1 capsule in the afternoon p.o. and 3 capsules in the evening before bedtime p.o. 08/02/20     hydrochlorothiazide (HYDRODIURIL) 12.5 MG tablet Take 1 tablet (12.5 mg total) by mouth daily. 04/04/19   Hassell Done, Mary-Margaret, FNP  hydrochlorothiazide (MICROZIDE) 12.5 MG capsule TAKE 1 CAPSULE BY MOUTH ONCE A DAY 11/16/20 11/16/21    methocarbamol (ROBAXIN) 500 MG tablet TAKE 1 TO 2 TABLETS BY MOUTH 3 TIMES DAILY AS NEEDED FOR MUSCLE SPASM 12/11/19 12/10/20  Girtha Hake I, MD  methocarbamol (ROBAXIN) 500 MG tablet Take 1 to 2 pills p.o. 3 times daily as needed muscle spasm 06/28/20     ondansetron (ZOFRAN ODT) 4 MG disintegrating tablet Take 1 tablet (4 mg total) by mouth every 6 (six) hours as needed for nausea. Patient not taking: Reported on 11/18/2020 06/11/19   Malachy Mood, MD  topiramate (TOPAMAX) 100 MG tablet Take 1 tablet (100 mg total) by mouth every morning. Patient not taking: Reported on 11/18/2020 01/05/19   Wieters, Hallie C, PA-C  topiramate (TOPAMAX) 50 MG tablet TAKE 1 TABLET BY MOUTH TWICE A DAY Patient not taking: Reported on 11/18/2020 11/20/19 11/19/20  Revelo,  Elyse Jarvis, MD  topiramate (TOPAMAX) 50 MG tablet TAKE 1 TABLET BY MOUTH TWICE A DAY Patient not taking: Reported on 11/18/2020 09/02/20     varenicline (CHANTIX) 1 MG tablet TAKE 1/2 TABLET BY MOUTH ONCE DAILY FOR 3 DAYS; TAKE 1/2 TABLET 2 TIMES DAILY FOR 4 DAYS; TAKE 1 TABLET 2 TIMES DAILY THEREAFTER 02/18/20 02/17/21  Algernon Huxley, MD  varenicline (CHANTIX) 1 MG tablet Take 1 tablet by mouth twice a day 05/31/20       Physical Exam Vitals: Blood pressure 112/62, pulse 83, height 5' 7.5" (1.715 m), weight 176 lb (79.8 kg), last menstrual period 11/05/2020.  General: NAD HEENT: normocephalic, anicteric Thyroid: no enlargement, no palpable nodules Pulmonary: No increased work of breathing, CTAB Cardiovascular: RRR, distal pulses 2+ Breast: Breast symmetrical, no tenderness, no palpable nodules or masses, no skin or nipple retraction present, no nipple discharge.  No axillary or supraclavicular lymphadenopathy. Abdomen: NABS, soft, non-tender, non-distended.  Umbilicus without lesions.  No hepatomegaly, splenomegaly or masses palpable. No evidence of hernia  Genitourinary:  External: Normal external female genitalia.  Normal urethral meatus, normal Bartholin's and Skene's glands.    Vagina: Normal vaginal mucosa, no evidence of prolapse.    Cervix: Grossly normal in appearance, no bleeding  Uterus: Non-enlarged, mobile, normal contour.  No CMT  Adnexa: ovaries non-enlarged, no adnexal masses  Rectal: deferred  Lymphatic: no evidence of inguinal lymphadenopathy Extremities: no edema, erythema, or tenderness Neurologic: Grossly intact Psychiatric: mood appropriate, affect full  Female chaperone present for pelvic and breast  portions of the physical exam    Assessment: 35 y.o. G2P0020 routine annual exam  Plan: Problem List Items Addressed This Visit   None Visit Diagnoses     Routine screening for STI (sexually transmitted infection)    -  Primary   Relevant Orders    Cervicovaginal ancillary only (Completed)   HEP, RPR, HIV Panel (Completed)   Encounter for gynecological examination without abnormal finding       Breast screening       Family history of breast cancer       Relevant Orders   Integrated BRACAnalysis ([facility])   Liver lesion       Relevant Orders   Ambulatory referral to Gastroenterology  1) STI screening  wasoffered and accepted  2)  ASCCP guidelines and rational discussed.  Patient opts for every 3 years screening interval  3) Contraception - the patient is currently using  none.  She is attempting to conceive in the near future   4) GI referral liver mass imaged 06/11/2019  4) Family history of breast cancer - BRCA testing  5) Routine healthcare maintenance including cholesterol, diabetes screening discussed managed by PCP  6) Return in about 1 year (around 11/18/2021) for annual.   Malachy Mood, MD, [place], [facility] 11/18/2020, 3:42 PM

## 2020-11-19 LAB — HEP, RPR, HIV PANEL
HIV Screen 4th Generation wRfx: NONREACTIVE
Hepatitis B Surface Ag: NEGATIVE
RPR Ser Ql: NONREACTIVE

## 2020-11-20 ENCOUNTER — Encounter: Payer: Self-pay | Admitting: Emergency Medicine

## 2020-11-20 ENCOUNTER — Ambulatory Visit
Admission: EM | Admit: 2020-11-20 | Discharge: 2020-11-20 | Disposition: A | Discharge status: Home / Self Care | Payer: 59 | Attending: Internal Medicine | Admitting: Internal Medicine

## 2020-11-20 ENCOUNTER — Other Ambulatory Visit: Payer: Self-pay

## 2020-11-20 DIAGNOSIS — J069 Acute upper respiratory infection, unspecified: Secondary | ICD-10-CM | POA: Diagnosis not present

## 2020-11-20 DIAGNOSIS — Z20822 Contact with and (suspected) exposure to covid-19: Secondary | ICD-10-CM | POA: Diagnosis not present

## 2020-11-20 MED ORDER — PSEUDOEPH-BROMPHEN-DM 30-2-10 MG/5ML PO SYRP: 5.0000 mL | ORAL_SOLUTION | Freq: Four times a day (QID) | ORAL | 0 refills | Status: DC | PRN

## 2020-11-20 MED ORDER — KETOROLAC TROMETHAMINE 10 MG PO TABS
10.0000 mg | ORAL_TABLET | Freq: Four times a day (QID) | ORAL | 0 refills | Status: DC | PRN
Start: 2020-11-20 — End: 2021-01-24

## 2020-11-20 NOTE — ED Triage Notes (Signed)
Patient c/o productive cough and headache x 1 day.   Patient endorses a temperature of 99.4 F at home.   Patient endorses nasal congestion. Patient endorses fatigue.   Patient denies sore throat. Patient denies N/V/D.   Patient has taken Mucinex complete and Tylenol w/ no relief of symptoms.

## 2020-11-20 NOTE — ED Provider Notes (Signed)
Rachel Garrett    CSN: [id number] Arrival date & time: 11/20/20  1321      History   Chief Complaint Chief Complaint  Patient presents with   Cough   Headache    HPI Rachel Garrett is a 35 y.o. female who presents with productive cough and nose congestion and HA x 1 day. Had a low grade temp of 99. 4 yesterday.  Denies ST, N/V/D. Has been taking Mucinex and tylenol and has not helped. She did not do a covid test at home. Has been fatigued. Appetite unchanged.  Has had covid 2 months ago. 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   Carpal tunnel syndrome 09/16/2018   Endometriosis determined by laparoscopy 02/19/2017   S/P myomectomy 11/02/2016   Right ovarian cyst 08/07/2016    Past Surgical History:  Procedure Laterality Date   CHROMOPERTUBATION N/A 02/27/2019   Procedure: CHROMOPERTUBATION;  Surgeon: Rachel Mood, MD;  Location: [facility];  Service: Gynecology;  Laterality: N/A;   HYSTEROSCOPY N/A 09/04/2017   Procedure: HYSTEROSCOPY;  Surgeon: Rachel Mood, MD;  Location: [facility];  Service: Gynecology;  Laterality: N/A;   LAPAROSCOPIC OVARIAN CYSTECTOMY N/A 02/27/2019   Procedure: LAPAROSCOPIC OVARIAN CYSTECTOMY;  Surgeon: Rachel Mood, MD;  Location: [facility];  Service: Gynecology;  Laterality: N/A;   LAPAROSCOPY N/A 09/04/2017   Procedure: LAPAROSCOPY DIAGNOSTIC;  Surgeon: Rachel Mood, MD;  Location: [facility];  Service: Gynecology;  Laterality: N/A;   LAPAROTOMY N/A 11/02/2016    Procedure: LAPAROTOMY;  Surgeon: Rachel Mood, MD;  Location: [facility];  Service: Gynecology;  Laterality: N/A;   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 Authorizing Provider  brompheniramine-pseudoephedrine-DM 30-2-10 MG/5ML syrup Take 5 mLs by mouth 4 (four) times daily as needed. 11/20/20  Yes Rodriguez-Southworth, Sunday Spillers, PA-C  FLUoxetine (PROZAC) 40 MG capsule Take 1 capsule by mouth once a day 07/19/20  Yes   gabapentin (NEURONTIN) 300 MG capsule Take 1 capsule by mouth 3 (three) times daily. 06/20/19  Yes [provider]  hydrochlorothiazide (HYDRODIURIL) 12.5 MG tablet Take 1 tablet (12.5 mg total) by mouth daily. 04/04/19  Yes Hassell Done, Mary-Margaret, FNP  ketorolac (TORADOL) 10 MG tablet Take 1 tablet (10 mg total) by mouth every 6 (six) hours as needed. 11/20/20  Yes Rodriguez-Southworth, Sunday Spillers, PA-C  topiramate (TOPAMAX) 100 MG tablet Take 1 tablet (100 mg total) by mouth every morning. 01/05/19  Yes Wieters, Hallie C, PA-C  traZODone (DESYREL) 50 MG tablet TAKE 1 TABLET BY MOUTH AT BEDTIME AS NEEDED FOR SLEEP 02/20/20 02/19/21 Yes Rachel Mood, MD  valACYclovir (VALTREX) 1000 MG tablet TAKE 1 TABLET BY MOUTH ONCE DAILY 11/12/20  Yes Rachel Mood, MD  varenicline (CHANTIX) 0.5 MG tablet Take 1 tablet (0.5mg ) once daily for 3 days, then 1 tablet (0.5mg ) twice  a day for 4 days, the 2 tablets (1mg ) twice a day. Then start 1mg  tablet twice daily. 05/31/20  Yes   albuterol (PROVENTIL) (2.5 MG/3ML) 0.083% nebulizer solution Take 3 mLs (2.5 mg total) by nebulization every 6 (six) hours as needed for wheezing or shortness of breath. 09/16/20   Mar Daring, PA-C  clindamycin (CLEOCIN T) 1 % external solution APPLY TOPICALLY TO AFFECTED AREAS TWICE A DAY 09/28/20     FLUoxetine (PROZAC) 20 MG capsule Take 1 capsule by mouth once a day 05/31/20     gabapentin (NEURONTIN) 300 MG capsule TAKE 3 CAPSULES BY MOUTH BY MOUTH AT BEDTIME 09/12/19 09/11/20  Girtha Hake I, MD  gabapentin (NEURONTIN) 300 MG capsule Take 3 pills p.o. qHS 06/28/20     gabapentin (NEURONTIN) 300 MG capsule Take 1 capsule in the morning p.o., 1 capsule in the afternoon p.o. and 3 capsules in the evening before bedtime p.o. 08/02/20     hydrochlorothiazide (MICROZIDE) 12.5 MG capsule TAKE 1 CAPSULE BY MOUTH ONCE DAILY FOR HIGH BLOOD PRESSURE 12/10/19 12/09/20  Revelo, Elyse Jarvis, MD  hydrochlorothiazide (MICROZIDE) 12.5 MG capsule TAKE 1 CAPSULE BY MOUTH ONCE A DAY 11/16/20 11/16/21    methocarbamol (ROBAXIN) 500 MG tablet  06/17/19   [provider]  methocarbamol (ROBAXIN) 500 MG tablet TAKE 1 TO 2 TABLETS BY MOUTH 3 TIMES DAILY AS NEEDED FOR MUSCLE SPASM 12/11/19 12/10/20  Girtha Hake I, MD  methocarbamol (ROBAXIN) 500 MG tablet Take 1 to 2 pills p.o. 3 times daily as needed muscle spasm 06/28/20     ondansetron (ZOFRAN ODT) 4 MG disintegrating tablet Take 1 tablet (4 mg total) by mouth every 6 (six) hours as needed for nausea. Patient not taking: No sig reported 06/11/19   Rachel Mood, MD  rizatriptan (MAXALT) 10 MG tablet Take 10 mg by mouth every 2 (two) hours as needed for migraine.  04/06/17   [provider]  topiramate (TOPAMAX) 50 MG tablet TAKE 1 TABLET BY MOUTH TWICE A DAY Patient not taking: No sig reported 09/02/20     varenicline (CHANTIX) 1 MG tablet  TAKE 1/2 TABLET BY MOUTH ONCE DAILY FOR 3 DAYS; TAKE 1/2 TABLET 2 TIMES DAILY FOR 4 DAYS; TAKE 1 TABLET 2 TIMES DAILY THEREAFTER 02/18/20 02/17/21  Algernon Huxley, MD  varenicline (CHANTIX) 1 MG tablet Take 1 tablet by mouth twice a day 05/31/20       Family History Family History  Problem Relation Age of Onset   Breast cancer Maternal Aunt        Great Aunt   Breast cancer Paternal Aunt 46   Cervical cancer Maternal Aunt    Breast cancer Paternal Aunt  Twin half sisters of dad    Social History Social History   Tobacco Use   Smoking status: Every Day    Packs/day: 0.25    Years: 10.00    Pack years: 2.50    Types: Cigarettes   Smokeless tobacco: Never  Vaping Use   Vaping Use: Never used  Substance Use Topics   Alcohol use: Yes    Alcohol/week: 2.0 standard drinks    Types: 2 Cans of beer per week    Comment: weekend   Drug use: Not Currently    Comment: PT DENIES BUT HAS A + UDS IN 2017 FOR MARIJUANA     Allergies   Chocolate flavor, Other, Bee venom, Benadryl [diphenhydramine hcl (sleep)], Chocolate, Estrogens, Lactose intolerance (gi), Latex, Ultram [tramadol hcl], and Iodine   Review of Systems Review of Systems  Constitutional:  Positive for appetite change, fatigue and fever. Negative for activity change and chills.  HENT:  Positive for congestion, postnasal drip and rhinorrhea. Negative for ear discharge, ear pain, sore throat and trouble swallowing.   Respiratory:  Positive for cough.   Gastrointestinal:  Negative for diarrhea, nausea and vomiting.  Musculoskeletal:  Negative for gait problem.  Skin:  Negative for rash.  Hematological:  Negative for adenopathy.    Physical Exam Triage Vital Signs ED Triage Vitals [11/20/20 1418]  Enc Vitals Group     BP 124/79     Pulse Rate 92     Resp 16     Temp 99 F (37.2 C)     Temp Source Oral     SpO2 98 %     Weight      Height      Head Circumference      Peak Flow      Pain Score 2     Pain Loc       Pain Edu?      Excl. in [place]?    No data found.  Updated Vital Signs BP 124/79 (BP Location: Right Arm)   Pulse 92   Temp 99 F (37.2 C) (Oral)   Resp 16   LMP 11/05/2020 (Approximate)   SpO2 98%   Visual Acuity Right Eye Distance:   Left Eye Distance:   Bilateral Distance:    Right Eye Near:   Left Eye Near:    Bilateral Near:     Physical Exam Physical Exam Vitals signs and nursing note reviewed.  Constitutional:      General: She is not in acute distress.    Appearance: Normal appearance. She is not ill-appearing, toxic-appearing or diaphoretic.  HENT:     Head: Normocephalic.     Right Ear: Tympanic membrane, ear canal and external ear normal.     Left Ear: Tympanic membrane, ear canal and external ear normal.     Nose: moderate congestion with clear mucous    Mouth/Throat:     Mouth: Mucous membranes are moist.  Eyes:     General: No scleral icterus.       Right eye: No discharge.        Left eye: No discharge.     Conjunctiva/sclera: Conjunctivae normal.  Neck:     Musculoskeletal: Neck supple. No neck rigidity.  Cardiovascular:     Rate and Rhythm: Normal rate and regular rhythm.     Heart sounds: No murmur.  Pulmonary:     Effort: Pulmonary effort is normal.     Breath sounds: Normal breath sounds.  Musculoskeletal: Normal range  of motion.  Lymphadenopathy:     Cervical: No cervical adenopathy.  Skin:    General: Skin is warm and dry.     Coloration: Skin is not jaundiced.     Findings: No rash.  Neurological:     Mental Status: She is alert and oriented to person, place, and time.     Gait: Gait normal.  Psychiatric:        Garrett and Affect: Garrett normal.        Behavior: Behavior normal.        Thought Content: Thought content normal.        Judgment: Judgment normal.    UC Treatments / Results  Labs (all labs ordered are listed, but only abnormal results are displayed) Labs Reviewed - No data to display  EKG   Radiology No results  found.  Procedures Procedures (including critical care time)  Medications Ordered in UC Medications - No data to display  Initial Impression / Assessment and Plan / UC Course  I have reviewed the triage vital signs and the nursing notes.Viral URI Covid test pending. I placed her on Toradol for HA and Bromfed D for nose congestion and cough. Stated Lavella Lemons does not help her cough.  Final Clinical Impressions(s) / UC Diagnoses   Final diagnoses:  Acute upper respiratory infection  Suspected COVID-19 virus infection     Discharge Instructions      Stay quarantined until your covid test is back.      ED Prescriptions     Medication Sig Dispense Auth. Provider   ketorolac (TORADOL) 10 MG tablet Take 1 tablet (10 mg total) by mouth every 6 (six) hours as needed. 20 tablet Rodriguez-Southworth, Sunday Spillers, PA-C   brompheniramine-pseudoephedrine-DM 30-2-10 MG/5ML syrup Take 5 mLs by mouth 4 (four) times daily as needed. 120 mL Rodriguez-Southworth, Sunday Spillers, PA-C      PDMP not reviewed this encounter.   Shelby Mattocks, PA-C 11/20/20 1445

## 2020-11-20 NOTE — Discharge Instructions (Addendum)
Stay quarantined until your covid test is back.

## 2020-11-21 LAB — NOVEL CORONAVIRUS, NAA: SARS-CoV-2, NAA: NOT DETECTED

## 2020-11-21 LAB — SARS-COV-2, NAA 2 DAY TAT

## 2020-11-22 LAB — CERVICOVAGINAL ANCILLARY ONLY
Chlamydia: NEGATIVE
Comment: NEGATIVE
Comment: NEGATIVE
Comment: NORMAL
Neisseria Gonorrhea: NEGATIVE
Trichomonas: NEGATIVE

## 2020-11-24 ENCOUNTER — Telehealth: Payer: 59 | Admitting: Physician Assistant

## 2020-11-24 ENCOUNTER — Other Ambulatory Visit: Payer: Self-pay

## 2020-11-24 DIAGNOSIS — J069 Acute upper respiratory infection, unspecified: Secondary | ICD-10-CM | POA: Diagnosis not present

## 2020-11-24 DIAGNOSIS — B9689 Other specified bacterial agents as the cause of diseases classified elsewhere: Secondary | ICD-10-CM | POA: Diagnosis not present

## 2020-11-24 MED ORDER — DOXYCYCLINE HYCLATE 100 MG PO TABS
100.0000 mg | ORAL_TABLET | Freq: Two times a day (BID) | ORAL | 0 refills | Status: DC
Start: 2020-11-24 — End: 2021-01-24
  Filled 2020-11-24: qty 14, 7d supply, fill #0

## 2020-11-24 MED ORDER — PROMETHAZINE-DM 6.25-15 MG/5ML PO SYRP
5.0000 mL | ORAL_SOLUTION | Freq: Four times a day (QID) | ORAL | 0 refills | Status: DC | PRN
  Filled 2020-11-24: qty 118, 6d supply, fill #0

## 2020-11-24 MED ORDER — BENZONATATE 100 MG PO CAPS
100.0000 mg | ORAL_CAPSULE | Freq: Three times a day (TID) | ORAL | 0 refills | Status: DC | PRN
  Filled 2020-11-24: qty 30, 10d supply, fill #0

## 2020-11-24 NOTE — Progress Notes (Signed)

## 2020-11-24 NOTE — Progress Notes (Signed)
I have spent 5 minutes in review of e-visit questionnaire, review and updating patient chart, medical decision making and response to patient.   Vonnie Spagnolo Cody Jahid Weida, PA-C    

## 2020-11-24 NOTE — Addendum Note (Signed)
Addended by: Brunetta Jeans on: 11/24/2020 02:42 PM   Modules accepted: Orders

## 2020-11-30 ENCOUNTER — Other Ambulatory Visit: Payer: Self-pay

## 2020-11-30 MED ORDER — METHYLPREDNISOLONE 4 MG PO TBPK
ORAL_TABLET | ORAL | 0 refills | Status: DC
  Filled 2020-11-30: qty 21, 6d supply, fill #0

## 2020-12-07 DIAGNOSIS — Z1509 Genetic susceptibility to other malignant neoplasm: Secondary | ICD-10-CM

## 2020-12-07 DIAGNOSIS — Z1379 Encounter for other screening for genetic and chromosomal anomalies: Secondary | ICD-10-CM

## 2020-12-07 DIAGNOSIS — Z1501 Genetic susceptibility to malignant neoplasm of breast: Secondary | ICD-10-CM

## 2020-12-07 HISTORY — DX: Encounter for other screening for genetic and chromosomal anomalies: Z13.79

## 2020-12-07 HISTORY — DX: Genetic susceptibility to malignant neoplasm of breast: Z15.01

## 2020-12-07 HISTORY — DX: Genetic susceptibility to other malignant neoplasm: Z15.09

## 2020-12-12 ENCOUNTER — Telehealth: Payer: 59 | Admitting: Family

## 2020-12-12 DIAGNOSIS — Z7251 High risk heterosexual behavior: Secondary | ICD-10-CM

## 2020-12-12 NOTE — Progress Notes (Signed)
Based on what you shared with me, I feel your condition warrants further evaluation and I recommend that you be seen in a face to face visit.  You usually do not need a prescription for this medication and can get at the pharmacy. However, you need to take this within 3 days of unprotected sex to be effective. It is out of the time frame for this medication to work.    NOTE: There will be NO CHARGE for this eVisit   If you are having a true medical emergency please call 911.      For an urgent face to face visit, [facility] has six urgent care centers for your convenience:     [facility] [place] [facility] [place] Get Driving Directions [phone] [address] [place], [place] [address]    [facility]) Get Driving Directions [phone] [place], [place] [address]  [facility] [place] [facility]) Get Driving Directions [phone] [address] [place],  [place]  [address]  [facility] Get Driving Directions [phone] [address] [place], [place], [place] [address]   [facility] Get Driving Directions  [phone] [address] [place], [place] [address]   [facility] [place] Get Driving Directions [phone] [address] [place],  [address]  Your MyChart E-visit questionnaire answers were reviewed by a board certified advanced clinical practitioner to complete your personal care plan based on your specific symptoms.  Thank you for using e-Visits.

## 2020-12-13 ENCOUNTER — Encounter: Payer: Self-pay | Admitting: Obstetrics and Gynecology

## 2020-12-20 ENCOUNTER — Other Ambulatory Visit: Payer: Self-pay

## 2020-12-21 ENCOUNTER — Other Ambulatory Visit: Payer: Self-pay

## 2020-12-22 ENCOUNTER — Other Ambulatory Visit: Payer: Self-pay

## 2020-12-22 ENCOUNTER — Telehealth: Payer: 59 | Admitting: Physician Assistant

## 2020-12-22 DIAGNOSIS — N809 Endometriosis, unspecified: Secondary | ICD-10-CM

## 2020-12-22 MED ORDER — TOPIRAMATE 50 MG PO TABS
50.0000 mg | ORAL_TABLET | Freq: Two times a day (BID) | ORAL | 0 refills | Status: DC
  Filled 2020-12-22: qty 180, 90d supply, fill #0

## 2020-12-22 NOTE — Progress Notes (Signed)
Based on what you shared with me, I feel your condition warrants further evaluation and I recommend that you be seen in a face to face visit. This is not something we can evaluate or treat via e-visit. If your OB and colleagues are unavailable, you need to either (1) call your PCP for further assessment and management, (2) submit a video visit through [facility] or (3) be seen at a local Urgent Care for treatment.    NOTE: There will be NO CHARGE for this eVisit   If you are having a true medical emergency please call 911.      For an urgent face to face visit, Rachel Garrett has six urgent care centers for your convenience:     [facility] [place] at  Get Driving Directions [phone] [address] [place], [place] [address]    [facility]) Get Driving Directions [phone] [place], [place] [address]  [facility] [place] [facility]) Get Driving Directions [phone] [address] [place],  [place]  [address]  [facility] at [facility] Get Driving Directions [phone] [address] [place], [place], [place] [address]   [facility] Get Driving Directions  [phone] [address] [place], [place] [address]   [facility] [place] Get Driving Directions [phone] [address] [place], [place] [address]  Your MyChart E-visit questionnaire answers were reviewed by a board certified advanced clinical practitioner to complete your personal care plan based on your specific symptoms.  Thank you for using e-Visits.

## 2020-12-27 DIAGNOSIS — Z0130 Encounter for examination of blood pressure without abnormal findings: Secondary | ICD-10-CM | POA: Diagnosis not present

## 2020-12-27 DIAGNOSIS — Z0000 Encounter for general adult medical examination without abnormal findings: Secondary | ICD-10-CM | POA: Diagnosis not present

## 2020-12-27 DIAGNOSIS — F17200 Nicotine dependence, unspecified, uncomplicated: Secondary | ICD-10-CM | POA: Diagnosis not present

## 2020-12-27 DIAGNOSIS — I10 Essential (primary) hypertension: Secondary | ICD-10-CM | POA: Diagnosis not present

## 2020-12-28 ENCOUNTER — Other Ambulatory Visit: Payer: Self-pay

## 2020-12-28 MED ORDER — TOPIRAMATE 50 MG PO TABS
50.0000 mg | ORAL_TABLET | Freq: Two times a day (BID) | ORAL | 3 refills | Status: AC
  Filled 2020-12-28 – 2021-03-31 (×2): qty 180, 90d supply, fill #0

## 2020-12-28 MED ORDER — HYDROCHLOROTHIAZIDE 12.5 MG PO CAPS
ORAL_CAPSULE | ORAL | 3 refills | Status: AC
  Filled 2020-12-28 – 2021-03-01 (×2): qty 90, 90d supply, fill #0

## 2020-12-29 ENCOUNTER — Encounter: Payer: Self-pay | Admitting: Obstetrics and Gynecology

## 2020-12-29 ENCOUNTER — Other Ambulatory Visit: Payer: Self-pay | Admitting: Obstetrics and Gynecology

## 2020-12-29 DIAGNOSIS — N809 Endometriosis, unspecified: Secondary | ICD-10-CM

## 2020-12-29 DIAGNOSIS — Z1501 Genetic susceptibility to malignant neoplasm of breast: Secondary | ICD-10-CM | POA: Insufficient documentation

## 2020-12-29 DIAGNOSIS — Z1589 Genetic susceptibility to other disease: Secondary | ICD-10-CM | POA: Insufficient documentation

## 2020-12-29 DIAGNOSIS — R102 Pelvic and perineal pain: Secondary | ICD-10-CM

## 2021-01-05 ENCOUNTER — Encounter: Payer: Self-pay | Admitting: Obstetrics and Gynecology

## 2021-01-06 ENCOUNTER — Other Ambulatory Visit: Payer: Self-pay

## 2021-01-06 ENCOUNTER — Encounter: Payer: Self-pay | Admitting: Obstetrics and Gynecology

## 2021-01-06 ENCOUNTER — Other Ambulatory Visit: Payer: Self-pay | Admitting: Obstetrics and Gynecology

## 2021-01-06 DIAGNOSIS — Z803 Family history of malignant neoplasm of breast: Secondary | ICD-10-CM

## 2021-01-06 DIAGNOSIS — Z9189 Other specified personal risk factors, not elsewhere classified: Secondary | ICD-10-CM

## 2021-01-06 DIAGNOSIS — Z1501 Genetic susceptibility to malignant neoplasm of breast: Secondary | ICD-10-CM

## 2021-01-06 DIAGNOSIS — Z1231 Encounter for screening mammogram for malignant neoplasm of breast: Secondary | ICD-10-CM

## 2021-01-06 MED ORDER — NORETHINDRONE ACETATE 5 MG PO TABS
5.0000 mg | ORAL_TABLET | Freq: Every day | ORAL | 11 refills | Status: AC
  Filled 2021-01-06: qty 30, 30d supply, fill #0
  Filled 2021-02-25: qty 30, 30d supply, fill #1
  Filled 2021-03-25: qty 30, 30d supply, fill #2
  Filled 2021-05-02: qty 30, 30d supply, fill #3

## 2021-01-06 NOTE — Telephone Encounter (Signed)
Patient following up on my chart messages. She hasn't received a response.

## 2021-01-13 ENCOUNTER — Other Ambulatory Visit: Payer: Self-pay

## 2021-01-13 ENCOUNTER — Ambulatory Visit
Admission: RE | Admit: 2021-01-13 | Discharge: 2021-01-13 | Disposition: A | Discharge status: Home / Self Care | Payer: 59 | Source: Ambulatory Visit | Attending: Obstetrics and Gynecology | Admitting: Obstetrics and Gynecology

## 2021-01-13 DIAGNOSIS — N809 Endometriosis, unspecified: Secondary | ICD-10-CM | POA: Insufficient documentation

## 2021-01-13 DIAGNOSIS — D251 Intramural leiomyoma of uterus: Secondary | ICD-10-CM | POA: Diagnosis not present

## 2021-01-13 DIAGNOSIS — N83291 Other ovarian cyst, right side: Secondary | ICD-10-CM | POA: Diagnosis not present

## 2021-01-13 DIAGNOSIS — D252 Subserosal leiomyoma of uterus: Secondary | ICD-10-CM | POA: Diagnosis not present

## 2021-01-17 ENCOUNTER — Other Ambulatory Visit: Payer: Self-pay

## 2021-01-17 DIAGNOSIS — K068 Other specified disorders of gingiva and edentulous alveolar ridge: Secondary | ICD-10-CM | POA: Diagnosis not present

## 2021-01-17 DIAGNOSIS — R220 Localized swelling, mass and lump, head: Secondary | ICD-10-CM | POA: Diagnosis not present

## 2021-01-17 MED ORDER — KETOROLAC TROMETHAMINE 10 MG PO TABS
ORAL_TABLET | ORAL | 0 refills | Status: DC
  Filled 2021-01-17 – 2021-01-21 (×2): qty 20, 5d supply, fill #0

## 2021-01-17 MED ORDER — AMOXICILLIN 875 MG PO TABS
ORAL_TABLET | ORAL | 0 refills | Status: DC
  Filled 2021-01-17 – 2021-01-21 (×2): qty 20, 10d supply, fill #0

## 2021-01-17 MED ORDER — HYDROCODONE-ACETAMINOPHEN 5-325 MG PO TABS
1.0000 | ORAL_TABLET | ORAL | 0 refills | Status: DC
  Filled 2021-01-17: qty 16, 3d supply, fill #0

## 2021-01-20 ENCOUNTER — Telehealth: Payer: 59 | Admitting: Nurse Practitioner

## 2021-01-20 DIAGNOSIS — K047 Periapical abscess without sinus: Secondary | ICD-10-CM

## 2021-01-20 MED ORDER — AMOXICILLIN 500 MG PO CAPS: 500.0000 mg | ORAL_CAPSULE | Freq: Three times a day (TID) | ORAL | 0 refills | Status: AC

## 2021-01-20 MED ORDER — IBUPROFEN 800 MG PO TABS: 800.0000 mg | ORAL_TABLET | Freq: Three times a day (TID) | ORAL | 0 refills | Status: DC | PRN

## 2021-01-20 NOTE — Patient Instructions (Addendum)
Rachel Garrett, thank you for joining Gildardo Pounds, NP for today's virtual visit.  While this provider is not your primary care provider (PCP), if your PCP is located in our provider database this encounter information will be shared with them immediately following your visit.  Consent: (Patient) Rachel Garrett provided verbal consent for this virtual visit at the beginning of the encounter.  Current Medications:  Current Outpatient Medications:    amoxicillin (AMOXIL) 500 MG capsule, Take 1 capsule (500 mg total) by mouth 3 (three) times daily for 10 days., Disp: 30 capsule, Rfl: 0   ibuprofen (ADVIL) 800 MG tablet, Take 1 tablet (800 mg total) by mouth every 8 (eight) hours as needed., Disp: 60 tablet, Rfl: 0   albuterol (PROVENTIL) (2.5 MG/3ML) 0.083% nebulizer solution, Take 3 mLs (2.5 mg total) by nebulization every 6 (six) hours as needed for wheezing or shortness of breath., Disp: 150 mL, Rfl: 1   benzonatate (TESSALON) 100 MG capsule, Take 1 capsule (100 mg total) by mouth 3 (three) times daily as needed for cough., Disp: 30 capsule, Rfl: 0   clindamycin (CLEOCIN T) 1 % external solution, APPLY TOPICALLY TO AFFECTED AREAS TWICE A DAY, Disp: 60 mL, Rfl: 5   doxycycline (VIBRA-TABS) 100 MG tablet, Take 1 tablet (100 mg total) by mouth 2 (two) times daily., Disp: 14 tablet, Rfl: 0   FLUoxetine (PROZAC) 20 MG capsule, Take 1 capsule by mouth once a day, Disp: 90 capsule, Rfl: 3   FLUoxetine (PROZAC) 40 MG capsule, Take 1 capsule by mouth once a day, Disp: 90 capsule, Rfl: 3   gabapentin (NEURONTIN) 300 MG capsule, Take 1 capsule by mouth 3 (three) times daily., Disp: , Rfl:    gabapentin (NEURONTIN) 300 MG capsule, TAKE 3 CAPSULES BY MOUTH BY MOUTH AT BEDTIME, Disp: 90 capsule, Rfl: 2   gabapentin (NEURONTIN) 300 MG capsule, Take 3 pills p.o. qHS, Disp: 90 capsule, Rfl: 5   gabapentin (NEURONTIN) 300 MG capsule, Take 1 capsule in the morning p.o., 1 capsule in the afternoon p.o.  and 3 capsules in the evening before bedtime p.o., Disp: 150 capsule, Rfl: 0   hydrochlorothiazide (HYDRODIURIL) 12.5 MG tablet, Take 1 tablet (12.5 mg total) by mouth daily., Disp: 30 tablet, Rfl: 0   hydrochlorothiazide (MICROZIDE) 12.5 MG capsule, TAKE 1 CAPSULE BY MOUTH ONCE DAILY FOR HIGH BLOOD PRESSURE, Disp: 30 capsule, Rfl: 0   hydrochlorothiazide (MICROZIDE) 12.5 MG capsule, Take 1 capsule by mouth once a day TAKE 1 CAPSULE BY MOUTH ONCE A DAY, Disp: 90 capsule, Rfl: 3   HYDROcodone-acetaminophen (NORCO/VICODIN) 5-325 MG tablet, Take 1 tablet by mouth every 4 to 6 hours as needed for pain., Disp: 16 tablet, Rfl: 0   ketorolac (TORADOL) 10 MG tablet, Take 1 tablet (10 mg total) by mouth every 6 (six) hours as needed., Disp: 20 tablet, Rfl: 0   methocarbamol (ROBAXIN) 500 MG tablet, , Disp: , Rfl:    methocarbamol (ROBAXIN) 500 MG tablet, Take 1 to 2 pills p.o. 3 times daily as needed muscle spasm, Disp: 180 tablet, Rfl: 2   methylPREDNISolone (MEDROL DOSEPAK) 4 MG TBPK tablet, Take 6 tablets by mouth on day 1, then decrease by one tablet each day, Disp: 21 tablet, Rfl: 0   norethindrone (AYGESTIN) 5 MG tablet, Take 1 tablet (5 mg total) by mouth daily., Disp: 30 tablet, Rfl: 11   ondansetron (ZOFRAN ODT) 4 MG disintegrating tablet, Take 1 tablet (4 mg total) by mouth every 6 (six) hours  as needed for nausea. (Patient not taking: No sig reported), Disp: 30 tablet, Rfl: 0   promethazine-dextromethorphan (PROMETHAZINE-DM) 6.25-15 MG/5ML syrup, Take 5 mLs by mouth 4 (four) times daily as needed for cough., Disp: 118 mL, Rfl: 0   rizatriptan (MAXALT) 10 MG tablet, Take 10 mg by mouth every 2 (two) hours as needed for migraine. , Disp: , Rfl: 0   topiramate (TOPAMAX) 100 MG tablet, Take 1 tablet (100 mg total) by mouth every morning., Disp: 30 tablet, Rfl: 0   topiramate (TOPAMAX) 50 MG tablet, TAKE 1 TABLET BY MOUTH TWICE A DAY (Patient not taking: Reported on 11/18/2020), Disp: 180 tablet, Rfl: 2    topiramate (TOPAMAX) 50 MG tablet, TAKE 1 TABLET BY MOUTH TWICE A DAY, Disp: 180 tablet, Rfl: 3   traZODone (DESYREL) 50 MG tablet, TAKE 1 TABLET BY MOUTH AT BEDTIME AS NEEDED FOR SLEEP, Disp: 30 tablet, Rfl: 4   valACYclovir (VALTREX) 1000 MG tablet, TAKE 1 TABLET BY MOUTH ONCE DAILY, Disp: 90 tablet, Rfl: 3   varenicline (CHANTIX) 0.5 MG tablet, Take 1 tablet (0.5mg ) once daily for 3 days, then 1 tablet (0.5mg ) twice a day for 4 days, the 2 tablets (1mg ) twice a day. Then start 1mg  tablet twice daily., Disp: 56 tablet, Rfl: 0   varenicline (CHANTIX) 1 MG tablet, TAKE 1/2 TABLET BY MOUTH ONCE DAILY FOR 3 DAYS; TAKE 1/2 TABLET 2 TIMES DAILY FOR 4 DAYS; TAKE 1 TABLET 2 TIMES DAILY THEREAFTER, Disp: 56 tablet, Rfl: 0   varenicline (CHANTIX) 1 MG tablet, Take 1 tablet by mouth twice a day, Disp: 120 tablet, Rfl: 0   Medications ordered in this encounter:  Meds ordered this encounter  Medications   amoxicillin (AMOXIL) 500 MG capsule    Sig: Take 1 capsule (500 mg total) by mouth 3 (three) times daily for 10 days.    Dispense:  30 capsule    Refill:  0    Order Specific Question:   Supervising Provider    Answer:   MILLER, BRIAN [[id number]]   ibuprofen (ADVIL) 800 MG tablet    Sig: Take 1 tablet (800 mg total) by mouth every 8 (eight) hours as needed.    Dispense:  60 tablet    Refill:  0    Order Specific Question:   Supervising Provider    Answer:   Sabra Heck, BRIAN [[id number]]     *If you need refills on other medications prior to your next appointment, please contact your pharmacy*  Follow-Up: Call back or seek an in-person evaluation if the symptoms worsen or if the condition fails to improve as anticipated.    If you have been instructed to have an in-person evaluation today at a local Urgent Care facility, please use the link below. It will take you to a list of all of our available [facility], including address, phone number and hours of operation. Please do not delay care.   [facility]  If you or a family member do not have a primary care provider, use the link below to schedule a visit and establish care. When you choose a [facility] primary care physician or advanced practice provider, you gain a long-term partner in health. Find a Primary Care Provider  Learn more about 's in-office and virtual care options: [facility]

## 2021-01-20 NOTE — Progress Notes (Signed)
Virtual Visit Consent   [name], you are scheduled for a virtual visit with a [facility] provider today.     Just as with appointments in the office, your consent must be obtained to participate.  Your consent will be active for this visit and any virtual visit you may have with one of our providers in the next 365 days.     If you have a MyChart account, a copy of this consent can be sent to you electronically.  All virtual visits are billed to your insurance company just like a traditional visit in the office.    As this is a virtual visit, video technology does not allow for your provider to perform a traditional examination.  This may limit your provider's ability to fully assess your condition.  If your provider identifies any concerns that need to be evaluated in person or the need to arrange testing (such as labs, EKG, etc.), we will make arrangements to do so.     Although advances in technology are sophisticated, we cannot ensure that it will always work on either your end or our end.  If the connection with a video visit is poor, the visit may have to be switched to a telephone visit.  With either a video or telephone visit, we are not always able to ensure that we have a secure connection.     I need to obtain your verbal consent now.   Are you willing to proceed with your visit today?    Rachel Garrett has provided verbal consent on 01/20/2021 for a virtual visit (video or telephone).   Gildardo Pounds, NP   Date: 01/20/2021 7:37 PM   Virtual Visit via Video Note   I, Gildardo Pounds, connected with  Rachel Garrett  ([id number], [birth year]) on 01/20/21 at  7:30 PM EST by a video-enabled telemedicine application and verified that I am speaking with the correct person using two identifiers.  Location: Patient: Virtual Visit Location Patient: Home Provider: Virtual Visit Location Provider: Home Office   I discussed the limitations of evaluation and  management by telemedicine and the availability of in person appointments. The patient expressed understanding and agreed to proceed.    History of Present Illness: Rachel Garrett is a 35 y.o. who identifies as a female who was assigned female at birth, and is being seen today for dental pain and infection.  HPI:  States she had her [place] teeth taken out in July over the past week she developed left sided swelling of her jaw, tonsillar lymphadenopathy and mouth pain. She went to see her oral surgeon Monday and states she was told she has a piece of bone trying to come through the gum tissue. She states the gum is tender and she believes she may have an infection as well.    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   Carpal tunnel syndrome 09/16/2018   Endometriosis determined by laparoscopy 02/19/2017   S/P myomectomy 11/02/2016   Right ovarian cyst 08/07/2016    Allergies:  Allergies  Allergen Reactions   Chocolate Flavor Other (See Comments)    Causes migraine   Other Itching and Swelling    Allergic to bees   Bee Venom Swelling   Benadryl [Diphenhydramine Hcl (Sleep)] Swelling  Chocolate     migraines   Estrogens     Increases chances of having stroke   Lactose Intolerance (Gi) Diarrhea and Nausea And Vomiting   Latex Itching and Swelling   Ultram [Tramadol Hcl] Itching   Iodine Itching   Medications:  Current Outpatient Medications:    amoxicillin (AMOXIL) 500 MG capsule, Take 1 capsule (500 mg total) by mouth 3 (three) times daily for 10 days., Disp: 30 capsule, Rfl: 0   ibuprofen (ADVIL) 800 MG tablet, Take 1 tablet (800 mg total) by mouth every 8 (eight) hours as needed., Disp: 60 tablet, Rfl: 0   albuterol (PROVENTIL) (2.5 MG/3ML) 0.083% nebulizer solution, Take 3 mLs  (2.5 mg total) by nebulization every 6 (six) hours as needed for wheezing or shortness of breath., Disp: 150 mL, Rfl: 1   benzonatate (TESSALON) 100 MG capsule, Take 1 capsule (100 mg total) by mouth 3 (three) times daily as needed for cough., Disp: 30 capsule, Rfl: 0   clindamycin (CLEOCIN T) 1 % external solution, APPLY TOPICALLY TO AFFECTED AREAS TWICE A DAY, Disp: 60 mL, Rfl: 5   doxycycline (VIBRA-TABS) 100 MG tablet, Take 1 tablet (100 mg total) by mouth 2 (two) times daily., Disp: 14 tablet, Rfl: 0   FLUoxetine (PROZAC) 20 MG capsule, Take 1 capsule by mouth once a day, Disp: 90 capsule, Rfl: 3   FLUoxetine (PROZAC) 40 MG capsule, Take 1 capsule by mouth once a day, Disp: 90 capsule, Rfl: 3   gabapentin (NEURONTIN) 300 MG capsule, Take 1 capsule by mouth 3 (three) times daily., Disp: , Rfl:    gabapentin (NEURONTIN) 300 MG capsule, TAKE 3 CAPSULES BY MOUTH BY MOUTH AT BEDTIME, Disp: 90 capsule, Rfl: 2   gabapentin (NEURONTIN) 300 MG capsule, Take 3 pills p.o. qHS, Disp: 90 capsule, Rfl: 5   gabapentin (NEURONTIN) 300 MG capsule, Take 1 capsule in the morning p.o., 1 capsule in the afternoon p.o. and 3 capsules in the evening before bedtime p.o., Disp: 150 capsule, Rfl: 0   hydrochlorothiazide (HYDRODIURIL) 12.5 MG tablet, Take 1 tablet (12.5 mg total) by mouth daily., Disp: 30 tablet, Rfl: 0   hydrochlorothiazide (MICROZIDE) 12.5 MG capsule, TAKE 1 CAPSULE BY MOUTH ONCE DAILY FOR HIGH BLOOD PRESSURE, Disp: 30 capsule, Rfl: 0   hydrochlorothiazide (MICROZIDE) 12.5 MG capsule, Take 1 capsule by mouth once a day TAKE 1 CAPSULE BY MOUTH ONCE A DAY, Disp: 90 capsule, Rfl: 3   HYDROcodone-acetaminophen (NORCO/VICODIN) 5-325 MG tablet, Take 1 tablet by mouth every 4 to 6 hours as needed for pain., Disp: 16 tablet, Rfl: 0   ketorolac (TORADOL) 10 MG tablet, Take 1 tablet (10 mg total) by mouth every 6 (six) hours as needed., Disp: 20 tablet, Rfl: 0   methocarbamol (ROBAXIN) 500 MG tablet, , Disp: ,  Rfl:    methocarbamol (ROBAXIN) 500 MG tablet, Take 1 to 2 pills p.o. 3 times daily as needed muscle spasm, Disp: 180 tablet, Rfl: 2   methylPREDNISolone (MEDROL DOSEPAK) 4 MG TBPK tablet, Take 6 tablets by mouth on day 1, then decrease by one tablet each day, Disp: 21 tablet, Rfl: 0   norethindrone (AYGESTIN) 5 MG tablet, Take 1 tablet (5 mg total) by mouth daily., Disp: 30 tablet, Rfl: 11   ondansetron (ZOFRAN ODT) 4 MG disintegrating tablet, Take 1 tablet (4 mg total) by mouth every 6 (six) hours as needed for nausea. (Patient not taking: No sig reported), Disp: 30 tablet, Rfl: 0   promethazine-dextromethorphan (PROMETHAZINE-DM) 6.25-15 MG/5ML  syrup, Take 5 mLs by mouth 4 (four) times daily as needed for cough., Disp: 118 mL, Rfl: 0   rizatriptan (MAXALT) 10 MG tablet, Take 10 mg by mouth every 2 (two) hours as needed for migraine. , Disp: , Rfl: 0   topiramate (TOPAMAX) 100 MG tablet, Take 1 tablet (100 mg total) by mouth every morning., Disp: 30 tablet, Rfl: 0   topiramate (TOPAMAX) 50 MG tablet, TAKE 1 TABLET BY MOUTH TWICE A DAY (Patient not taking: Reported on 11/18/2020), Disp: 180 tablet, Rfl: 2   topiramate (TOPAMAX) 50 MG tablet, TAKE 1 TABLET BY MOUTH TWICE A DAY, Disp: 180 tablet, Rfl: 3   traZODone (DESYREL) 50 MG tablet, TAKE 1 TABLET BY MOUTH AT BEDTIME AS NEEDED FOR SLEEP, Disp: 30 tablet, Rfl: 4   valACYclovir (VALTREX) 1000 MG tablet, TAKE 1 TABLET BY MOUTH ONCE DAILY, Disp: 90 tablet, Rfl: 3   varenicline (CHANTIX) 0.5 MG tablet, Take 1 tablet (0.55m) once daily for 3 days, then 1 tablet (0.568m twice a day for 4 days, the 2 tablets (55m65mtwice a day. Then start 55mg68mblet twice daily., Disp: 56 tablet, Rfl: 0   varenicline (CHANTIX) 1 MG tablet, TAKE 1/2 TABLET BY MOUTH ONCE DAILY FOR 3 DAYS; TAKE 1/2 TABLET 2 TIMES DAILY FOR 4 DAYS; TAKE 1 TABLET 2 TIMES DAILY THEREAFTER, Disp: 56 tablet, Rfl: 0   varenicline (CHANTIX) 1 MG tablet, Take 1 tablet by mouth twice a day, Disp: 120  tablet, Rfl: 0  Observations/Objective: Patient is well-developed, well-nourished in no acute distress.  Resting comfortably  at home.  Head is normocephalic, atraumatic.  No labored breathing.  Speech is clear and coherent with logical content.  Patient is alert and oriented at baseline.    Assessment and Plan: 1. Dental infection - amoxicillin (AMOXIL) 500 MG capsule; Take 1 capsule (500 mg total) by mouth 3 (three) times daily for 10 days.  Dispense: 30 capsule; Refill: 0 - ibuprofen (ADVIL) 800 MG tablet; Take 1 tablet (800 mg total) by mouth every 8 (eight) hours as needed.  Dispense: 60 tablet; Refill: 0    Follow Up Instructions: I discussed the assessment and treatment plan with the patient. The patient was provided an opportunity to ask questions and all were answered. The patient agreed with the plan and demonstrated an understanding of the instructions.  A copy of instructions were sent to the patient via MyChart unless otherwise noted below.   The patient was advised to call back or seek an in-person evaluation if the symptoms worsen or if the condition fails to improve as anticipated.  Time:  I spent 10 minutes with the patient via telehealth technology discussing the above problems/concerns.    ZeldGildardo Pounds

## 2021-01-21 ENCOUNTER — Other Ambulatory Visit: Payer: Self-pay

## 2021-01-24 ENCOUNTER — Other Ambulatory Visit: Payer: Self-pay

## 2021-01-24 ENCOUNTER — Ambulatory Visit (INDEPENDENT_AMBULATORY_CARE_PROVIDER_SITE_OTHER): Payer: 59 | Admitting: Obstetrics and Gynecology

## 2021-01-24 ENCOUNTER — Encounter: Payer: Self-pay | Admitting: Obstetrics and Gynecology

## 2021-01-24 VITALS — BP 120/78 | Ht 67.5 in | Wt 178.0 lb

## 2021-01-24 DIAGNOSIS — N809 Endometriosis, unspecified: Secondary | ICD-10-CM

## 2021-01-24 DIAGNOSIS — D250 Submucous leiomyoma of uterus: Secondary | ICD-10-CM

## 2021-01-24 DIAGNOSIS — D251 Intramural leiomyoma of uterus: Secondary | ICD-10-CM

## 2021-01-24 MED ORDER — LUPRON DEPOT (3-MONTH) 11.25 MG IM KIT
11.2500 mg | PACK | INTRAMUSCULAR | 1 refills | Status: DC
  Filled 2021-01-24: qty 1, 84d supply, fill #0
  Filled 2021-02-01: qty 1, 30d supply, fill #0
  Filled 2021-02-15: qty 1, 90d supply, fill #0

## 2021-01-24 NOTE — Progress Notes (Signed)
Gynecology Ultrasound Follow Up  Chief Complaint:  Chief Complaint  Patient presents with   Follow-up     History of Present Illness: Patient is a 35 y.o. female who presents today for ultrasound evaluation of for pelvic pain, AUB.  Ultrasound demonstrates the following findgins Adnexa: right adnexa with probably small right ovarian endometrioma 2cm Uterus: Multiple small uterine fibroids 2cm or less in size with at least one having a submucosal component with endometrial stripe focal abnormality Additional: no free fluid  Review of Systems: Review of Systems  Constitutional: Negative.   Gastrointestinal:  Positive for abdominal pain. Negative for blood in stool, constipation, diarrhea, heartburn, melena, nausea and vomiting.  Genitourinary: Negative.    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   [facility] MyRisk testing; increased risk of breast and pancreatic cancer    Past Surgical History:  Past Surgical History:  Procedure Laterality Date   CHROMOPERTUBATION N/A 02/27/2019   Procedure: CHROMOPERTUBATION;  Surgeon: Malachy Mood, MD;  Location: [facility];  Service: Gynecology;  Laterality: N/A;   HYSTEROSCOPY N/A 09/04/2017   Procedure: HYSTEROSCOPY;  Surgeon: Malachy Mood, MD;  Location: [facility];  Service: Gynecology;  Laterality: N/A;   LAPAROSCOPIC OVARIAN CYSTECTOMY N/A 02/27/2019   Procedure: LAPAROSCOPIC OVARIAN CYSTECTOMY;  Surgeon: Malachy Mood, MD;  Location: [facility];  Service: Gynecology;  Laterality: N/A;   LAPAROSCOPY N/A 09/04/2017   Procedure: LAPAROSCOPY DIAGNOSTIC;  Surgeon: Malachy Mood, MD;  Location: [facility];  Service: Gynecology;  Laterality: N/A;   LAPAROTOMY N/A 11/02/2016   Procedure: LAPAROTOMY;  Surgeon: Malachy Mood, MD;  Location: [facility];  Service: Gynecology;  Laterality: N/A;   LIVER BIOPSY  at birth   MYOMECTOMY N/A 11/02/2016   Procedure: MYOMECTOMY;  Surgeon: Malachy Mood, MD;  Location: [facility];  Service: Gynecology;  Laterality: N/A;   OVARIAN CYST REMOVAL Right 11/02/2016   Procedure: OVARIAN CYSTECTOMY;  Surgeon: Malachy Mood, MD;  Location: [facility];  Service: Gynecology;  Laterality: Right;   TONSILLECTOMY     TONSILLECTOMY      Gynecologic History:  No LMP recorded. (Menstrual status: Oral contraceptives). Last Pap: 12/24/2018 Results were: . NILM HPV negatie  Family History:  Family History  Problem Relation Age of Onset   Breast cancer Maternal Aunt        Great Aunt   Breast cancer Paternal Aunt 5   Cervical cancer Maternal Aunt    Breast cancer Paternal 34        Twin half sisters of dad    Social History:  Social History   Socioeconomic History   Marital status: Single    Spouse name: Not on file   Number of children: Not on file   Years of education: Not on file   Highest education level: Not on file  Occupational History  Not on file  Tobacco Use   Smoking status: Every Day    Packs/day: 0.25    Years: 10.00    Pack years: 2.50    Types: Cigarettes   Smokeless tobacco: Never  Vaping Use   Vaping Use: Never used  Substance and Sexual Activity   Alcohol use: Yes    Alcohol/week: 2.0 standard drinks    Types: 2 Cans of beer per week    Comment: weekend   Drug use: Not Currently    Comment: PT DENIES BUT HAS A + UDS IN 2017 FOR MARIJUANA   Sexual activity: Yes    Birth control/protection: None  Other Topics Concern   Not on file   Social History Narrative   Not on file   Social Determinants of Health   Financial Resource Strain: Not on file  Food Insecurity: Not on file  Transportation Needs: Not on file  Physical Activity: Not on file  Stress: Not on file  Social Connections: Not on file  Intimate Partner Violence: Not on file    Allergies:  Allergies  Allergen Reactions   Chocolate Flavor Other (See Comments)    Causes migraine   Other Itching and Swelling    Allergic to bees   Bee Venom Swelling   Benadryl [Diphenhydramine Hcl (Sleep)] Swelling   Chocolate     migraines   Estrogens     Increases chances of having stroke   Lactose Intolerance (Gi) Diarrhea and Nausea And Vomiting   Latex Itching and Swelling   Ultram [Tramadol Hcl] Itching   Iodine Itching    Medications: Prior to Admission medications   Medication Sig Start Date End Date Taking? Authorizing Provider  albuterol (PROVENTIL) (2.5 MG/3ML) 0.083% nebulizer solution Take 3 mLs (2.5 mg total) by nebulization every 6 (six) hours as needed for wheezing or shortness of breath. 09/16/20   Mar Daring, PA-C  amoxicillin (AMOXIL) 500 MG capsule Take 1 capsule (500 mg total) by mouth 3 (three) times daily for 10 days. Patient not taking: Reported on 01/24/2021 01/20/21 01/30/21  Gildardo Pounds, NP  amoxicillin (AMOXIL) 875 MG tablet Take 1 tablet by mouth every 12 hours Patient not taking: Reported on 01/24/2021 01/17/21   Kris Hartmann, NP  clindamycin (CLEOCIN T) 1 % external solution APPLY TOPICALLY TO AFFECTED AREAS TWICE A DAY 09/28/20     FLUoxetine (PROZAC) 20 MG capsule Take 1 capsule by mouth once a day 05/31/20     FLUoxetine (PROZAC) 40 MG capsule Take 1 capsule by mouth once a day 07/19/20     gabapentin (NEURONTIN) 300 MG capsule Take 1 capsule by mouth 3 (three) times daily. 06/20/19   [provider]  gabapentin (NEURONTIN) 300 MG capsule TAKE 3 CAPSULES BY MOUTH BY MOUTH AT BEDTIME 09/12/19 09/11/20  Girtha Hake I, MD  gabapentin (NEURONTIN) 300 MG capsule Take 3 pills p.o. qHS 06/28/20     gabapentin (NEURONTIN) 300 MG capsule Take 1 capsule in the morning p.o., 1 capsule in the afternoon p.o. and 3 capsules in the evening before bedtime p.o. 08/02/20     hydrochlorothiazide (HYDRODIURIL) 12.5 MG tablet Take 1 tablet (12.5 mg total) by mouth daily. 04/04/19   Hassell Done Mary-Margaret, FNP  hydrochlorothiazide (MICROZIDE) 12.5 MG capsule TAKE 1 CAPSULE BY MOUTH ONCE DAILY FOR HIGH BLOOD PRESSURE 12/10/19 12/09/20  Revelo, Elyse Jarvis, MD  hydrochlorothiazide (MICROZIDE) 12.5 MG capsule Take 1 capsule by mouth once a day TAKE 1 CAPSULE BY MOUTH ONCE A DAY  12/27/20     HYDROcodone-acetaminophen (NORCO/VICODIN) 5-325 MG tablet Take 1 tablet by mouth every 4 to 6 hours as needed for pain. 01/17/21     ibuprofen (ADVIL) 800 MG tablet Take 1 tablet (800 mg total) by mouth every 8 (eight) hours as needed. 01/20/21   Gildardo Pounds, NP  ketorolac (TORADOL) 10 MG tablet take 1 tablet by mouth every 6 hours as needed for no more than 5 days 01/17/21   Kris Hartmann, NP  methocarbamol (ROBAXIN) 500 MG tablet  06/17/19   [provider]  norethindrone (AYGESTIN) 5 MG tablet Take 1 tablet (5 mg total) by mouth daily. 01/06/21   Malachy Mood, MD  promethazine-dextromethorphan (PROMETHAZINE-DM) 6.25-15 MG/5ML syrup Take 5 mLs by mouth 4 (four) times daily as needed for cough. 11/24/20   Brunetta Jeans, PA-C  rizatriptan (MAXALT) 10 MG tablet Take 10 mg by mouth every 2 (two) hours as needed for migraine.  04/06/17   [provider]  topiramate (TOPAMAX) 100 MG tablet Take 1 tablet (100 mg total) by mouth every morning. 01/05/19   Wieters, Hallie C, PA-C  topiramate (TOPAMAX) 50 MG tablet TAKE 1 TABLET BY MOUTH TWICE A DAY Patient not taking: Reported on 11/18/2020 11/20/19 11/19/20  Revelo, Elyse Jarvis, MD  topiramate (TOPAMAX) 50 MG tablet TAKE 1 TABLET BY MOUTH TWICE A DAY 12/27/20      traZODone (DESYREL) 50 MG tablet TAKE 1 TABLET BY MOUTH AT BEDTIME AS NEEDED FOR SLEEP 02/20/20 02/19/21  Malachy Mood, MD  valACYclovir (VALTREX) 1000 MG tablet TAKE 1 TABLET BY MOUTH ONCE DAILY 11/12/20   Malachy Mood, MD  varenicline (CHANTIX) 0.5 MG tablet Take 1 tablet (0.67m) once daily for 3 days, then 1 tablet (0.566m twice a day for 4 days, the 2 tablets (5m39mtwice a day. Then start 5mg39mblet twice daily. 05/31/20     varenicline (CHANTIX) 1 MG tablet TAKE 1/2 TABLET BY MOUTH ONCE DAILY FOR 3 DAYS; TAKE 1/2 TABLET 2 TIMES DAILY FOR 4 DAYS; TAKE 1 TABLET 2 TIMES DAILY THEREAFTER 02/18/20 02/17/21  Dew,Algernon Huxley  varenicline (CHANTIX) 1 MG tablet Take 1 tablet by mouth twice a day 05/31/20       Physical Exam Vitals: Blood pressure 120/78, height 5' 7.5" (1.715 m), weight 178 lb (80.7 kg).  General: NAD HEENT: normocephalic, anicteric Pulmonary: No increased work of breathing Extremities: no edema, erythema, or tenderness Neurologic: Grossly intact, normal gait Psychiatric: mood appropriate, affect full  US PKoreaVIC COMPLETE WITH TRANSVAGINAL  Result Date: 01/13/2021 CLINICAL DATA:  Initial evaluation for endometriosis. EXAM: TRANSABDOMINAL AND TRANSVAGINAL ULTRASOUND OF PELVIS TECHNIQUE: Both transabdominal and transvaginal ultrasound examinations of the pelvis were performed. Transabdominal technique was performed for global imaging of the pelvis including uterus, ovaries, adnexal regions, and pelvic cul-de-sac. It was necessary to proceed with endovaginal exam following the transabdominal exam to visualize the uterus, endometrium, and ovaries. COMPARISON:  Prior ultrasound from 06/20/2019. FINDINGS: Uterus Measurements: 6.3 x 4.9 x 6.2 cm = volume: 101.0 mL. Uterus is anteverted. Heterogeneous echotexture within the uterine myometrium with multiple fibroids seen as follows; 1. 2.4 x 2.2 x 2.2 cm intramural to submucosal fibroid at the central uterine fundus. 2. 2.7 x 2.8 x 2.4 cm  intramural to submucosal fibroid at the posterior uterine body/fundus. 3. 1.8 x 1.8 x 1.9 cm intramural fibroid at the right posterior lower uterine segment. 4. At least 2 additional smaller subserosal fibroids noted about the periphery of the uterine body as  well. Endometrium Thickness: 9.3 mm. Endometrial stripe partially obscured and deformed by adjacent fibroids. No other focal abnormality. Right ovary Measurements: 4.0 x 2.6 x 2.9 cm = volume: 16.0 mL. 2.1 x 1.9 x 1.8 cm complex cyst with low level internal homogeneous echoes. No internal vascularity or solid nodularity. Left ovary Measurements: 3.4 x 1.8 x 1.9 cm = volume: 6.0 mL. Normal appearance/no adnexal mass. Other findings No abnormal free fluid. IMPRESSION: 1. 2.1 cm complex right ovarian cyst, favored to reflect an endometrioma given provided history. An evolving hemorrhagic cyst would be the primary differential consideration. 2. Fibroid uterus as detailed above. 3. Normal endometrium and left ovary.  No free fluid. Electronically Signed   By: Jeannine Boga M.D.   On: 01/13/2021 19:25    Assessment: 35 y.o. G2P0020 ultrasound follow up pelvic pain AUB   Plan: Problem List Items Addressed This Visit       Other   Endometriosis determined by laparoscopy - Primary   Other Visit Diagnoses     Intramural and submucous leiomyoma of uterus           1) Uterine fibroids and endometriosis - Patient status post abdominal myomectomy and ovarian cystectomy 11/02/2016, she underwent diagnostic laparoscopy, chromopertubation, hysteroscopy for pelvic pain 09/04/2017 with some adhesive disease but otherwise well healed postoperative findings. At this point spill on chromopertubation both tubes.  She underwent left ovarian cystectomy for two endometriomas on 02/27/2019, right ovary was densely adherent ot the pelvic side wall, spill on chromopertubation on the left not the right.  At this point she would like require right oophorectomy and  salpingectomy as no spill was noted on the right 02/26/2017. She has completed a 2 year course of Freida Busman and is currently on norethindrone. - 6 month course of Depo Lupron - re-image uterus post 6 months treatment course - location of fibroids would make repeat myomectomy difficult although the submucosal component may be partial accessible via hysteroscopy.  Discussed REI referral for this.  2) A total of 15 minutes were spent in face-to-face contact with the patient during this encounter with over half of that time devoted to counseling and coordination of care.  3) Return for Depo Lupron injection once authorized.    Malachy Mood, MD, [facility], [facility] 01/24/2021, 4:22 PM

## 2021-02-01 ENCOUNTER — Ambulatory Visit: Payer: 59 | Admitting: Gastroenterology

## 2021-02-01 ENCOUNTER — Other Ambulatory Visit: Payer: Self-pay

## 2021-02-01 DIAGNOSIS — G8929 Other chronic pain: Secondary | ICD-10-CM | POA: Diagnosis not present

## 2021-02-01 DIAGNOSIS — M62838 Other muscle spasm: Secondary | ICD-10-CM | POA: Diagnosis not present

## 2021-02-01 DIAGNOSIS — M48062 Spinal stenosis, lumbar region with neurogenic claudication: Secondary | ICD-10-CM | POA: Diagnosis not present

## 2021-02-01 DIAGNOSIS — M5441 Lumbago with sciatica, right side: Secondary | ICD-10-CM | POA: Diagnosis not present

## 2021-02-01 MED ORDER — GABAPENTIN 300 MG PO CAPS
ORAL_CAPSULE | ORAL | 5 refills | Status: DC
  Filled 2021-02-01: qty 150, 30d supply, fill #0

## 2021-02-02 ENCOUNTER — Ambulatory Visit: Admit: 2021-02-02 | Discharge status: Absent without leave | Payer: 59

## 2021-02-15 ENCOUNTER — Other Ambulatory Visit: Payer: Self-pay

## 2021-02-15 DIAGNOSIS — M7582 Other shoulder lesions, left shoulder: Secondary | ICD-10-CM | POA: Diagnosis not present

## 2021-02-25 ENCOUNTER — Other Ambulatory Visit: Payer: Self-pay

## 2021-02-25 MED FILL — Valacyclovir HCl Tab 1 GM: ORAL | 90 days supply | Qty: 90 | Fill #1 | Status: AC

## 2021-02-27 ENCOUNTER — Encounter: Payer: Self-pay | Admitting: Obstetrics and Gynecology

## 2021-03-01 ENCOUNTER — Other Ambulatory Visit: Payer: Self-pay | Admitting: Orthopedic Surgery

## 2021-03-01 ENCOUNTER — Other Ambulatory Visit (HOSPITAL_COMMUNITY): Payer: Self-pay | Admitting: Orthopedic Surgery

## 2021-03-01 ENCOUNTER — Other Ambulatory Visit: Payer: Self-pay

## 2021-03-01 DIAGNOSIS — M542 Cervicalgia: Secondary | ICD-10-CM

## 2021-03-01 MED ORDER — MELOXICAM 15 MG PO TABS
ORAL_TABLET | ORAL | 2 refills | Status: AC
  Filled 2021-03-01: qty 60, 60d supply, fill #0

## 2021-03-01 MED ORDER — CYCLOBENZAPRINE HCL 5 MG PO TABS
ORAL_TABLET | ORAL | 0 refills | Status: DC
  Filled 2021-03-01: qty 60, 30d supply, fill #0

## 2021-03-01 MED ORDER — METHOCARBAMOL 500 MG PO TABS
ORAL_TABLET | ORAL | 1 refills | Status: AC
  Filled 2021-03-01: qty 60, 15d supply, fill #0
  Filled 2021-03-25: qty 60, 15d supply, fill #1

## 2021-03-02 ENCOUNTER — Other Ambulatory Visit: Payer: Self-pay

## 2021-03-02 MED ORDER — RIZATRIPTAN BENZOATE 10 MG PO TABS
ORAL_TABLET | ORAL | 6 refills | Status: DC
  Filled 2021-03-02: qty 10, 30d supply, fill #0

## 2021-03-07 ENCOUNTER — Ambulatory Visit: Admission: RE | Admit: 2021-03-07 | Payer: 59 | Source: Ambulatory Visit

## 2021-03-08 ENCOUNTER — Other Ambulatory Visit: Payer: Self-pay | Admitting: Orthopedic Surgery

## 2021-03-08 DIAGNOSIS — M542 Cervicalgia: Secondary | ICD-10-CM

## 2021-03-10 ENCOUNTER — Other Ambulatory Visit: Payer: Self-pay

## 2021-03-10 MED ORDER — FLUOXETINE HCL 20 MG PO CAPS
ORAL_CAPSULE | ORAL | 2 refills | Status: DC
  Filled 2021-03-10: qty 90, 90d supply, fill #0

## 2021-03-11 DIAGNOSIS — M5441 Lumbago with sciatica, right side: Secondary | ICD-10-CM | POA: Diagnosis not present

## 2021-03-11 DIAGNOSIS — M48062 Spinal stenosis, lumbar region with neurogenic claudication: Secondary | ICD-10-CM | POA: Diagnosis not present

## 2021-03-13 ENCOUNTER — Other Ambulatory Visit: Payer: Self-pay

## 2021-03-13 ENCOUNTER — Ambulatory Visit
Admission: RE | Admit: 2021-03-13 | Discharge: 2021-03-13 | Disposition: A | Discharge status: Home / Self Care | Payer: 59 | Source: Ambulatory Visit | Attending: Orthopedic Surgery | Admitting: Orthopedic Surgery

## 2021-03-13 DIAGNOSIS — M542 Cervicalgia: Secondary | ICD-10-CM | POA: Insufficient documentation

## 2021-03-13 DIAGNOSIS — M47812 Spondylosis without myelopathy or radiculopathy, cervical region: Secondary | ICD-10-CM | POA: Diagnosis not present

## 2021-03-16 ENCOUNTER — Ambulatory Visit
Admission: RE | Admit: 2021-03-16 | Discharge: 2021-03-16 | Disposition: A | Discharge status: Home / Self Care | Payer: 59 | Source: Ambulatory Visit | Attending: Family Medicine | Admitting: Family Medicine

## 2021-03-16 ENCOUNTER — Other Ambulatory Visit
Admission: RE | Admit: 2021-03-16 | Discharge: 2021-03-16 | Disposition: A | Discharge status: Home / Self Care | Payer: 59 | Source: Ambulatory Visit | Attending: Family Medicine | Admitting: Family Medicine

## 2021-03-16 ENCOUNTER — Other Ambulatory Visit: Payer: Self-pay | Admitting: Family Medicine

## 2021-03-16 DIAGNOSIS — M79662 Pain in left lower leg: Secondary | ICD-10-CM | POA: Diagnosis not present

## 2021-03-23 ENCOUNTER — Ambulatory Visit (INDEPENDENT_AMBULATORY_CARE_PROVIDER_SITE_OTHER): Payer: 59 | Admitting: Gastroenterology

## 2021-03-23 ENCOUNTER — Other Ambulatory Visit: Payer: Self-pay

## 2021-03-23 ENCOUNTER — Encounter: Payer: Self-pay | Admitting: Gastroenterology

## 2021-03-23 VITALS — BP 137/96 | HR 112 | Temp 98.9°F | Ht 67.5 in | Wt 178.4 lb

## 2021-03-23 DIAGNOSIS — R160 Hepatomegaly, not elsewhere classified: Secondary | ICD-10-CM | POA: Diagnosis not present

## 2021-03-23 DIAGNOSIS — R935 Abnormal findings on diagnostic imaging of other abdominal regions, including retroperitoneum: Secondary | ICD-10-CM

## 2021-03-23 NOTE — Progress Notes (Signed)
Jonathon Bellows MD, MRCP(U.K) [address]  [place]  [place], [place] [address]  Main: [phone]  Fax: [phone]   Gastroenterology Consultation  Referring Provider:     Malachy Mood, MD Primary Care Physician:  [facility] Primary Gastroenterologist:  Dr. Jonathon Bellows  Reason for Consultation:    Liver lesion        HPI:   Rachel Garrett is a 36 y.o. y/o female referred for a liver lesion back in October 2022  Reviewed pelvic ultrasound from December 2022 I cannot see any liver lesion mention.  MRI of cervical spine without contrast in February 2023 shows no GI abnormalities in May 2021 CT scan of the abdomen pelvis with contrast showed a 2.6 x 2.6 x 3.8 enhancing lesion in the right lobe of the liver differential diagnosis would include a hepatic adenoma, focal nodular hyperplasia and atypical hemangioma further evaluation with MRI is recommended.  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   [facility] MyRisk testing; increased risk of breast and pancreatic cancer    Past Surgical History:  Procedure Laterality Date   CHROMOPERTUBATION N/A 02/27/2019   Procedure: CHROMOPERTUBATION;  Surgeon: Malachy Mood, MD;  Location: [facility];  Service: Gynecology;  Laterality: N/A;   HYSTEROSCOPY N/A 09/04/2017   Procedure: HYSTEROSCOPY;  Surgeon: Malachy Mood, MD;  Location: [facility];  Service: Gynecology;  Laterality: N/A;   LAPAROSCOPIC OVARIAN  CYSTECTOMY N/A 02/27/2019   Procedure: LAPAROSCOPIC OVARIAN CYSTECTOMY;  Surgeon: Malachy Mood, MD;  Location: [facility];  Service: Gynecology;  Laterality: N/A;   LAPAROSCOPY N/A 09/04/2017   Procedure: LAPAROSCOPY DIAGNOSTIC;  Surgeon: Malachy Mood, MD;  Location: [facility];  Service: Gynecology;  Laterality: N/A;   LAPAROTOMY N/A 11/02/2016   Procedure: LAPAROTOMY;  Surgeon: Malachy Mood, MD;  Location: [facility];  Service: Gynecology;  Laterality: N/A;   LIVER BIOPSY  at birth   MYOMECTOMY N/A 11/02/2016   Procedure: MYOMECTOMY;  Surgeon: Malachy Mood, MD;  Location: [facility];  Service: Gynecology;  Laterality: N/A;   OVARIAN CYST REMOVAL Right 11/02/2016   Procedure: OVARIAN CYSTECTOMY;  Surgeon: Malachy Mood, MD;  Location: [facility];  Service: Gynecology;  Laterality: Right;   TONSILLECTOMY     TONSILLECTOMY      Prior to Admission medications   Medication Sig Start Date End Date Taking? Authorizing Provider  albuterol (PROVENTIL) (2.5 MG/3ML) 0.083% nebulizer solution Take 3 mLs (2.5 mg total) by nebulization every 6 (six) hours as needed for wheezing or shortness of breath. 09/16/20   Mar Daring, PA-C  citalopram (CELEXA) 20 MG tablet Take 1 tablet by mouth daily.    [provider]  clindamycin (CLEOCIN T) 1 % external solution APPLY TOPICALLY TO AFFECTED AREAS TWICE A DAY 09/28/20     cyclobenzaprine (FLEXERIL) 10 MG tablet Take 10 mg by mouth at  bedtime. 02/14/21   [provider]  hydrochlorothiazide (HYDRODIURIL) 12.5 MG tablet Take 1 tablet (12.5 mg total) by mouth daily. 04/04/19   Hassell Done Mary-Margaret, FNP  hydrochlorothiazide (MICROZIDE) 12.5 MG capsule TAKE 1 CAPSULE BY MOUTH ONCE DAILY FOR HIGH BLOOD PRESSURE 12/10/19 12/09/20  Revelo, Elyse Jarvis, MD  hydrochlorothiazide (MICROZIDE) 12.5 MG capsule Take 1 capsule by mouth once a day TAKE 1 CAPSULE BY MOUTH ONCE A DAY 12/27/20     HYDROcodone-acetaminophen (NORCO/VICODIN) 5-325 MG  tablet Take 1 tablet by mouth every 4 to 6 hours as needed for pain. 01/17/21     leuprolide (LUPRON DEPOT, 25-MONTH,) 11.25 MG injection Inject 11.25 mg into the muscle every 3 (three) months. 01/24/21   Malachy Mood, MD  meloxicam (MOBIC) 15 MG tablet Take 1 tablet by mouth once a day with food for inflammation 03/01/21     norethindrone (AYGESTIN) 5 MG tablet Take 1 tablet (5 mg total) by mouth daily. 01/06/21   Malachy Mood, MD  rizatriptan (MAXALT) 10 MG tablet Take 10 mg by mouth every 2 (two) hours as needed for migraine.  04/06/17   [provider]  rizatriptan (MAXALT) 10 MG tablet Take 1 tablet (10 mg total) by mouth once daily as needed for Migraine MAY TAKE A SECOND DOSE AFTER 2 HOURS IF NEEDED 03/02/21     topiramate (TOPAMAX) 100 MG tablet Take 1 tablet (100 mg total) by mouth every morning. 01/05/19   Wieters, Hallie C, PA-C  topiramate (TOPAMAX) 50 MG tablet TAKE 1 TABLET BY MOUTH TWICE A DAY Patient not taking: Reported on 11/18/2020 11/20/19 11/19/20  Revelo, Elyse Jarvis, MD  topiramate (TOPAMAX) 50 MG tablet TAKE 1 TABLET BY MOUTH TWICE A DAY 12/27/20     traZODone (DESYREL) 50 MG tablet TAKE 1 TABLET BY MOUTH AT BEDTIME AS NEEDED FOR SLEEP 02/20/20 02/19/21  Malachy Mood, MD  valACYclovir (VALTREX) 1000 MG tablet TAKE 1 TABLET BY MOUTH ONCE DAILY 11/12/20   Malachy Mood, MD  varenicline (CHANTIX) 0.5 MG tablet Take 1 tablet (0.534m) once daily for 3 days, then 1 tablet (0.5434m twice a day for 4 days, the 2 tablets (34m47mtwice a day. Then start 34mg55mblet twice daily. 05/31/20       Family History  Problem Relation Age of Onset   Breast cancer Maternal Aunt        Great Aunt   Breast cancer Paternal Aunt 50  45ervical cancer Maternal Aunt    Breast cancer Paternal Aunt64    Twin half sisters of dad     Social History   Tobacco Use   Smoking status: Every Day    Packs/day: 0.25    Years: 10.00    Pack years: 2.50    Types: Cigarettes    Smokeless tobacco: Never  Vaping Use   Vaping Use: Never used  Substance Use Topics   Alcohol use: Yes    Alcohol/week: 2.0 standard drinks    Types: 2 Cans of beer per week    Comment: weekend   Drug use: Not Currently    Comment: PT DENIES BUT HAS A + UDS IN 2017 FOR MARIJUANA    Allergies as of 03/23/2021 - Review Complete 01/20/2021  Allergen Reaction Noted   Bee venom Swelling and Itching 02/11/2019   Chocolate flavor Other (See Comments) 05/13/2019   Iodinated contrast media Hives 09/12/2019   Other Itching and Swelling 05/13/2019   Benadryl [diphenhydramine hcl (sleep)] Swelling 10/16/2014   Chocolate  08/29/2017   Cocoa  08/29/2017   Estrogens  08/29/2017   Lactose intolerance (gi) Diarrhea and Nausea And Vomiting 02/19/2020   Latex Itching and Swelling 10/16/2014   Ultram [tramadol hcl] Itching 08/31/2016   Iodine Itching 03/05/2020    Review of Systems:    All systems reviewed and negative except where noted in HPI.   Physical Exam:  There were no vitals taken for this visit. No LMP recorded. (Menstrual status: Oral contraceptives). Psych:  Alert and cooperative. Normal mood and affect. General:   Alert,  Well-developed, well-nourished, pleasant and cooperative in NAD Head:  Normocephalic and atraumatic. Eyes:  Sclera clear, no icterus.   Conjunctiva pink. Pulses:  Normal pulses noted. Extremities:  No clubbing or edema.  No cyanosis. Neurologic:  Alert and oriented x3;  grossly normal neurologically. Skin:  Intact without significant lesions or rashes. No jaundice. Lymph Nodes:  No significant cervical adenopathy. Psych:  Alert and cooperative. Normal mood and affect.  Imaging Studies: MR CERVICAL SPINE WO CONTRAST  Result Date: 03/13/2021 CLINICAL DATA:  Provided history: Neck pain. Additional history provided by scanning technologist: Patient reports neck pain that radiates into left shoulder for 4 months status post lifting injury. EXAM: MRI CERVICAL SPINE  WITHOUT CONTRAST TECHNIQUE: Multiplanar, multisequence MR imaging of the cervical spine was performed. No intravenous contrast was administered. COMPARISON:  Cervical spine MRI 07/15/2019. FINDINGS: Alignment: Straightening of the expected cervical lordosis. No significant spondylolisthesis. Vertebrae: Vertebral body height is maintained. No significant marrow edema or focal suspicious osseous lesion. Cord: No signal abnormality identified within the cervical spinal cord. Posterior Fossa, vertebral arteries, paraspinal tissues: No abnormality identified within included portions of the posterior fossa. Flow voids preserved within the imaged cervical vertebral arteries. Paraspinal soft tissues unremarkable. Disc levels: Unless otherwise stated, the level by level findings below have not significantly changed from the prior MRI of 07/15/2019. Minimal multilevel disc degeneration. C2-C3: No significant disc herniation or stenosis. C3-C4: Slight disc bulge, new from the prior exam. No significant spinal canal or foraminal stenosis. C4-C5: Slight disc bulge, new from the prior exam. No significant spinal canal or foraminal stenosis. C5-C6: Slight disc bulge. No significant spinal canal or foraminal stenosis. C6-C7: Slight disc bulge. No significant spinal canal or foraminal stenosis. C7-T1: No significant disc herniation or stenosis. IMPRESSION: Mild cervical spondylosis, as outlined and having slightly progressed from the prior cervical spine MRI of 07/15/2019. No significant spinal canal or foraminal stenosis. Nonspecific straightening of the expected cervical lordosis. Electronically Signed   By: Kellie Simmering D.O.   On: 03/13/2021 14:08   US Venous Img Lower Unilateral Left (DVT)  Result Date: 03/17/2021 CLINICAL DATA:  LEFT calf pain x1 day. EXAM: LEFT LOWER EXTREMITY VENOUS DOPPLER ULTRASOUND TECHNIQUE: Gray-scale sonography with compression, as well as color and duplex ultrasound, were performed to evaluate the  deep venous system(s) from the level of the common femoral vein through the popliteal and proximal calf veins. COMPARISON:  CT AP, 06/10/2019. FINDINGS: VENOUS Normal compressibility of the LEFT common femoral, superficial femoral, and popliteal veins, as well as the visualized calf veins. Visualized portions of profunda femoral vein and great saphenous vein unremarkable. No filling defects to suggest DVT on grayscale or color Doppler imaging. Doppler waveforms show normal direction of venous flow, normal respiratory plasticity and response to augmentation. Limited views of the contralateral common femoral vein are unremarkable. OTHER No evidence of superficial thrombophlebitis or abnormal fluid collection. Limitations: none IMPRESSION: No evidence of femoropopliteal DVT within the LEFT lower  extremity. Michaelle Birks, MD [facility] Radiology Electronically Signed   By: Michaelle Birks M.D.   On: 03/17/2021 07:51    Assessment and Plan:   KENI WAFER is a 36 y.o. y/o female has been referred for a possible liver lesion seen in May 2021 on CT scan of the abdomen and pelvis with contrast which was performed for left lower quadrant pain of 3 months duration.  Differential diagnosis entertained for the lesion included hepatic adenoma versus focal nodular hyperplasia versus an atypical hemangioma further evaluation with an MRI was recommended.  Plan 1.  MRI of the liver to evaluate possible mass.   Follow up in 8 weeks  Dr Jonathon Bellows MD,MRCP(U.K)

## 2021-03-23 NOTE — Patient Instructions (Addendum)
Please do not have anything to eat or drink 4 hours prior to your MRI.

## 2021-03-25 ENCOUNTER — Other Ambulatory Visit: Payer: Self-pay

## 2021-03-31 ENCOUNTER — Other Ambulatory Visit: Payer: Self-pay

## 2021-04-01 ENCOUNTER — Other Ambulatory Visit: Payer: Self-pay

## 2021-04-01 DIAGNOSIS — R292 Abnormal reflex: Secondary | ICD-10-CM | POA: Diagnosis not present

## 2021-04-01 DIAGNOSIS — G43119 Migraine with aura, intractable, without status migrainosus: Secondary | ICD-10-CM | POA: Diagnosis not present

## 2021-04-01 DIAGNOSIS — M62838 Other muscle spasm: Secondary | ICD-10-CM | POA: Diagnosis not present

## 2021-04-01 DIAGNOSIS — R519 Headache, unspecified: Secondary | ICD-10-CM | POA: Diagnosis not present

## 2021-04-01 MED ORDER — TOPIRAMATE 100 MG PO TABS
ORAL_TABLET | ORAL | 3 refills | Status: AC
  Filled 2021-04-01: qty 90, 90d supply, fill #0

## 2021-04-04 ENCOUNTER — Other Ambulatory Visit: Payer: Self-pay | Admitting: Neurology

## 2021-04-04 DIAGNOSIS — R519 Headache, unspecified: Secondary | ICD-10-CM

## 2021-04-05 ENCOUNTER — Other Ambulatory Visit: Payer: 59

## 2021-04-05 ENCOUNTER — Ambulatory Visit: Admission: RE | Admit: 2021-04-05 | Payer: 59 | Source: Ambulatory Visit

## 2021-04-06 ENCOUNTER — Ambulatory Visit: Payer: PRIVATE HEALTH INSURANCE | Attending: Orthopedic Surgery

## 2021-04-13 ENCOUNTER — Ambulatory Visit: Admission: RE | Admit: 2021-04-13 | Payer: 59 | Source: Ambulatory Visit

## 2021-04-25 ENCOUNTER — Other Ambulatory Visit (HOSPITAL_COMMUNITY): Payer: Self-pay

## 2021-05-03 ENCOUNTER — Other Ambulatory Visit (HOSPITAL_COMMUNITY): Payer: Self-pay

## 2021-05-04 ENCOUNTER — Other Ambulatory Visit: Payer: Self-pay

## 2021-05-04 ENCOUNTER — Other Ambulatory Visit (HOSPITAL_COMMUNITY): Payer: Self-pay

## 2021-05-23 ENCOUNTER — Telehealth: Payer: 59 | Admitting: Gastroenterology

## 2021-06-14 DIAGNOSIS — D250 Submucous leiomyoma of uterus: Secondary | ICD-10-CM | POA: Insufficient documentation

## 2021-06-14 DIAGNOSIS — R102 Pelvic and perineal pain unspecified side: Secondary | ICD-10-CM | POA: Insufficient documentation

## 2021-06-14 DIAGNOSIS — G8929 Other chronic pain: Secondary | ICD-10-CM | POA: Insufficient documentation

## 2021-07-22 ENCOUNTER — Ambulatory Visit: Payer: 59

## 2021-10-24 DIAGNOSIS — F419 Anxiety disorder, unspecified: Secondary | ICD-10-CM | POA: Insufficient documentation

## 2021-10-24 DIAGNOSIS — F331 Major depressive disorder, recurrent, moderate: Secondary | ICD-10-CM | POA: Insufficient documentation

## 2021-12-03 IMAGING — MR MR CERVICAL SPINE W/O CM
5 series · 41 of 48 positions shown · non-contrast
Comparison: Radiographs 12/19/2015

CLINICAL DATA: Bilateral arm pain for 1 year. No acute injury or
prior relevant surgery.

EXAM:
MRI CERVICAL SPINE WITHOUT CONTRAST
TECHNIQUE: Multiplanar, multisequence MR imaging of the cervical spine was
performed. No intravenous contrast was administered.

[Series 5: T2 · sagittal · 3.0mm · 0.62mm/px · 6 of 15 slices shown (1 of 2)]
[im 1/15]
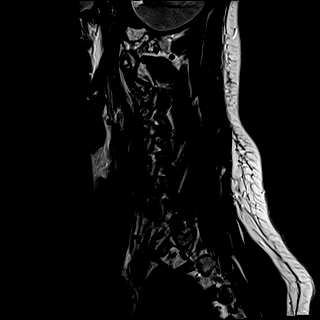
[im 3/15]
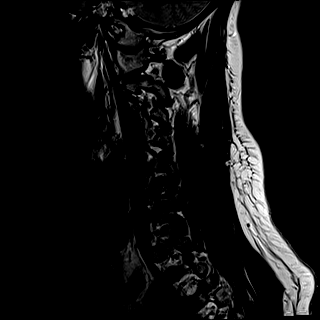
[im 6/15]
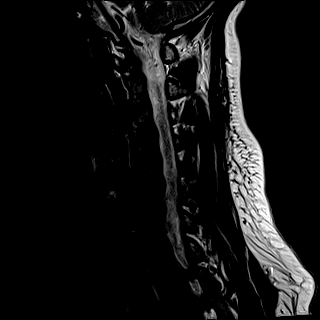
[im 9/15]
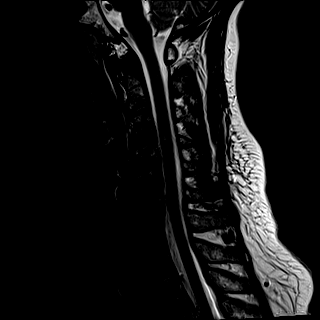
[im 12/15]
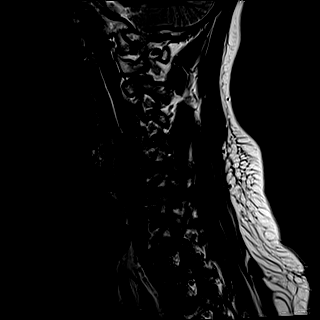
[im 15/15]
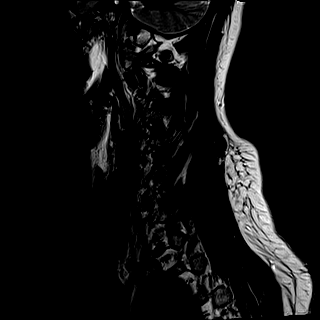

[Series 6: FLAIR · sagittal · 3.0mm · 0.78mm/px · 7 of 15 slices shown]
[im 1/15]
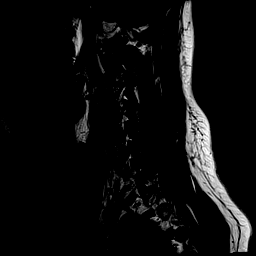
[im 3/15]
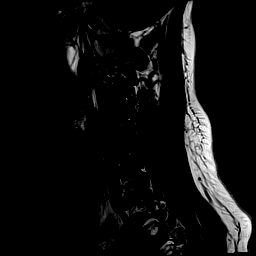
[im 5/15]
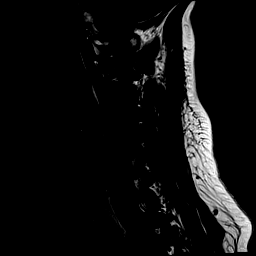
[im 8/15]
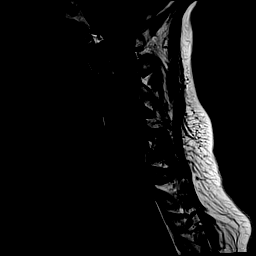
[im 10/15]
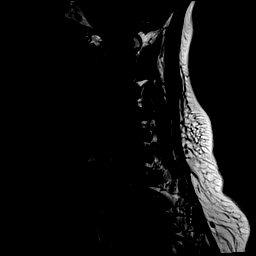
[im 12/15]
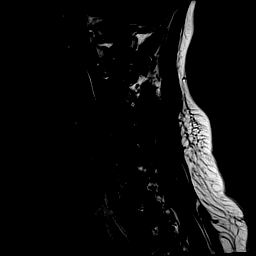
[im 15/15]
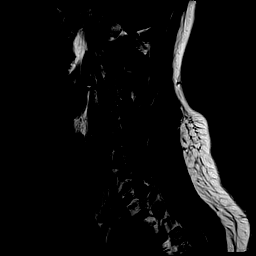

[Series 7: STIR · sagittal · 3.0mm · 0.62mm/px · 7 of 15 slices shown]
[im 1/15]
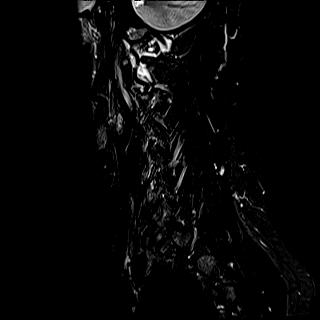
[im 3/15]
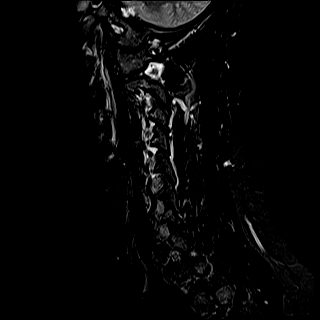
[im 5/15]
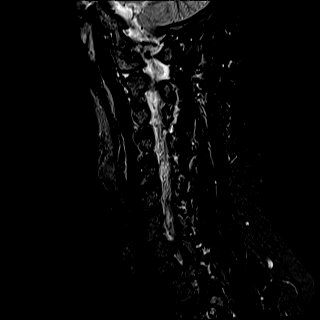
[im 8/15]
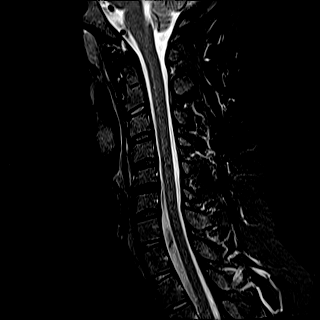
[im 10/15]
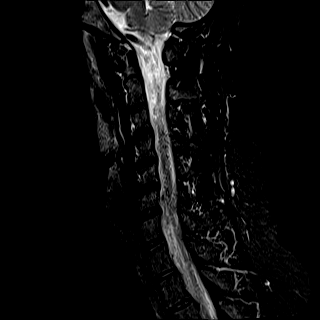
[im 12/15]
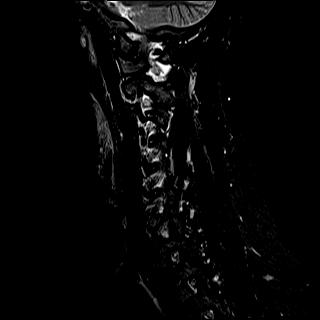
[im 15/15]
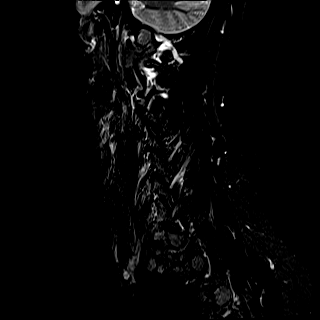

[Series 8: T2 · axial · 3.0mm · 0.70mm/px · z∈[-132,-37]mm · 13 of 29 slices shown (2 of 2)]
[im 1/29]
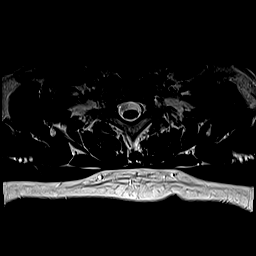
[im 3/29]
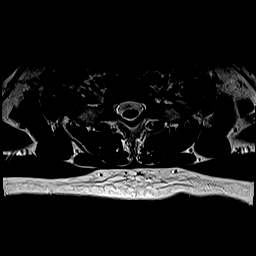
[im 5/29]
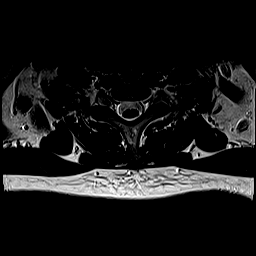
[im 7/29]
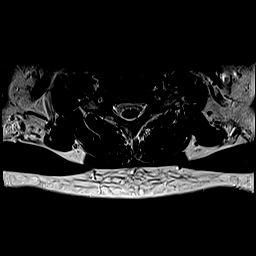
[im 9/29]
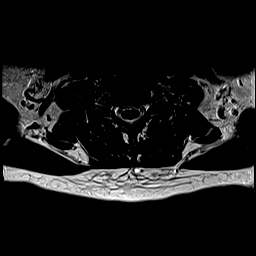
[im 11/29]
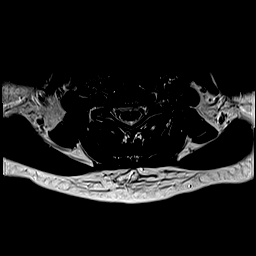
[im 13/29]
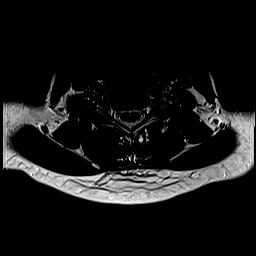
[im 16/29]
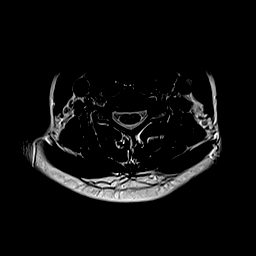
[im 18/29]
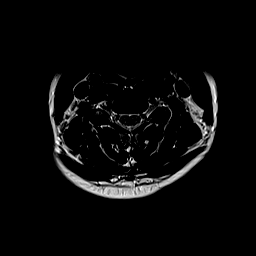
[im 20/29]
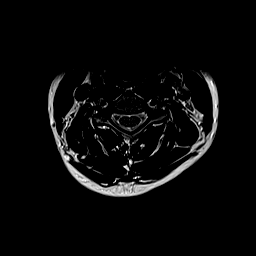
[im 22/29]
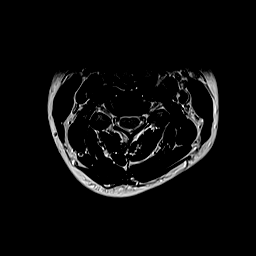
[im 24/29]
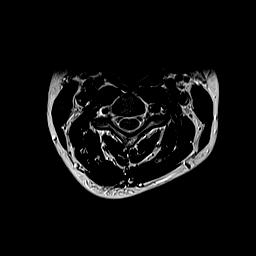
[im 29/29]
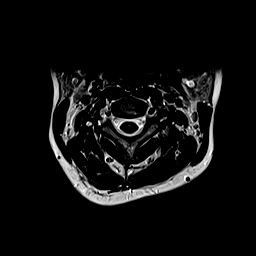

[Series 9: ax mpgr · axial · 3.0mm · 0.35mm/px · z∈[-132,-37]mm · 8 of 29 slices shown]
[im 1/29]
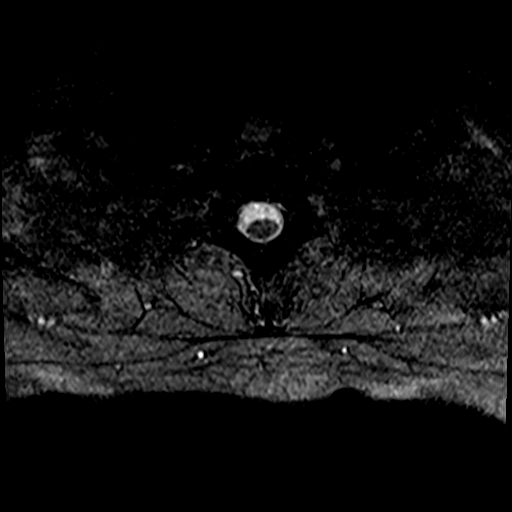
[im 5/29]
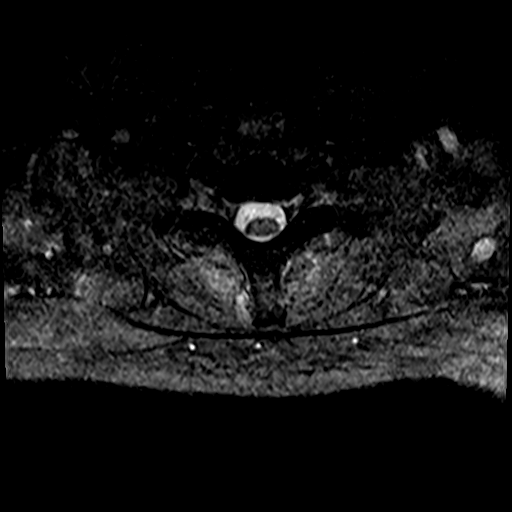
[im 9/29]
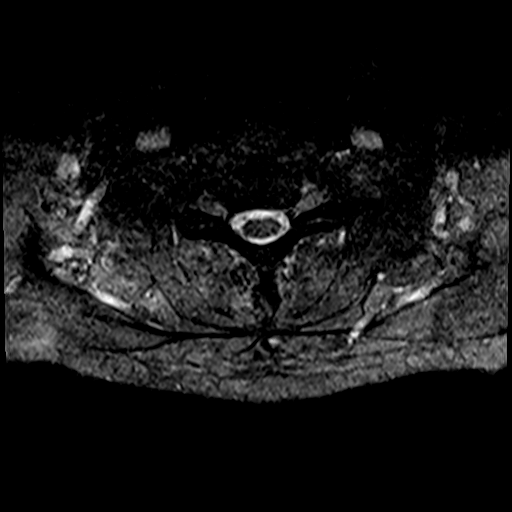
[im 13/29]
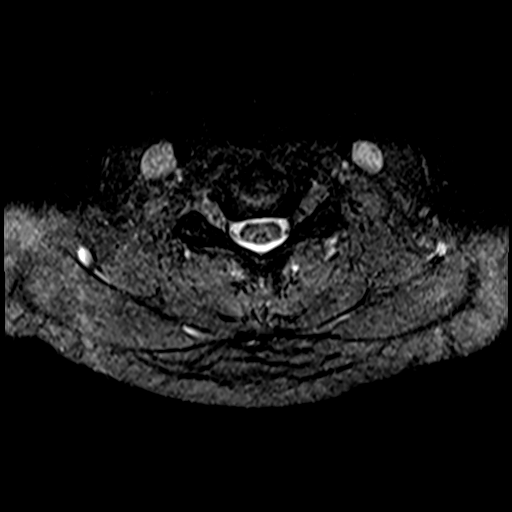
[im 16/29]
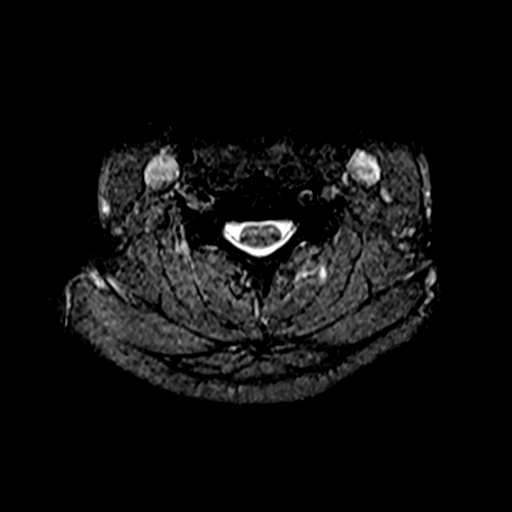
[im 20/29]
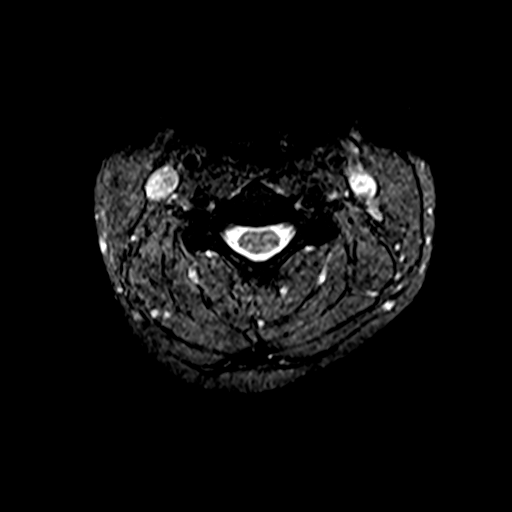
[im 24/29]
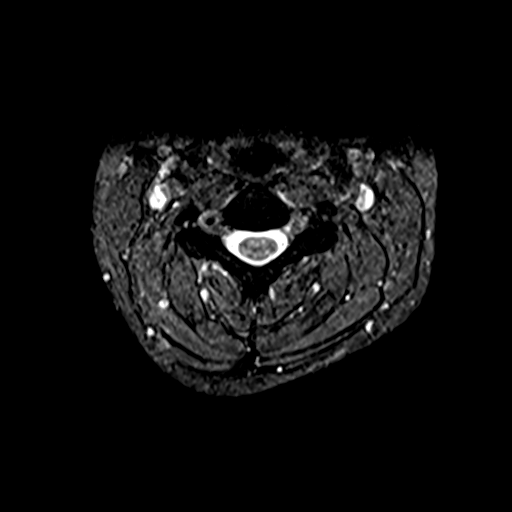
[im 29/29]
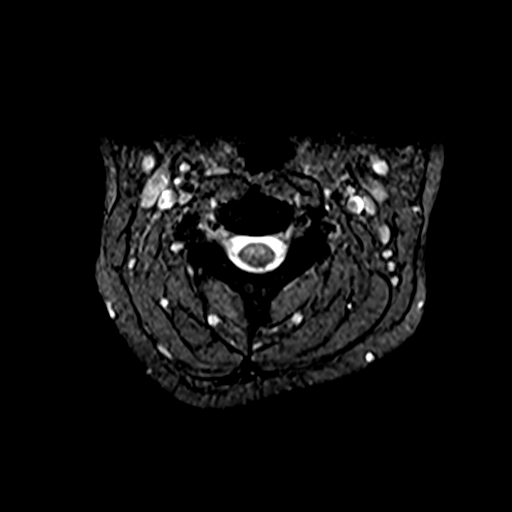

[41 of 48 positions shown; findings below may reference images not displayed]

FINDINGS: Alignment: Stable.  No focal angulation or listhesis.

Vertebrae: No acute or suspicious osseous findings.

Cord: Normal in signal and caliber.

Posterior Fossa, vertebral arteries, paraspinal tissues: Visualized
portions of the posterior fossa and paraspinal soft tissues appear
unremarkable. Bilateral vertebral artery flow voids.

Disc levels:

The disc heights are preserved at each level. There is minimal disc
bulging at C5-6 and C6-7. There is no cord deformity, foraminal
narrowing or nerve root encroachment. No significant facet
arthropathy demonstrated.
IMPRESSION: 1. Near normal examination. No acute findings or explanation for the
patient's symptoms.
2. Minimal disc bulging at C5-6 and C6-7.

## 2022-02-04 ENCOUNTER — Telehealth: Payer: 59 | Admitting: Nurse Practitioner

## 2022-02-04 ENCOUNTER — Telehealth: Payer: 59

## 2022-03-09 ENCOUNTER — Telehealth: Payer: Commercial Managed Care - PPO | Admitting: Family Medicine

## 2022-03-09 DIAGNOSIS — N898 Other specified noninflammatory disorders of vagina: Secondary | ICD-10-CM

## 2022-03-09 NOTE — Progress Notes (Signed)
Because recent abnormal bleeding vaginal as noted in chart back on 03/06/2022, and recent intercourse with a new partner, I feel your condition warrants further evaluation and might need urine and or vaginal testing swabs and I recommend that you be seen in a face to face visit.   NOTE: There will be NO CHARGE for this eVisit

## 2022-04-17 ENCOUNTER — Encounter: Payer: Self-pay | Admitting: Nurse Practitioner

## 2022-04-17 ENCOUNTER — Telehealth: Payer: Self-pay | Admitting: Nurse Practitioner

## 2022-04-17 DIAGNOSIS — M542 Cervicalgia: Secondary | ICD-10-CM | POA: Insufficient documentation

## 2022-04-17 DIAGNOSIS — T148XXA Other injury of unspecified body region, initial encounter: Secondary | ICD-10-CM

## 2022-04-17 MED ORDER — CYCLOBENZAPRINE HCL 10 MG PO TABS: 10.0000 mg | ORAL_TABLET | Freq: Every day | ORAL | 0 refills | Status: AC

## 2022-04-17 NOTE — Progress Notes (Signed)
Virtual Visit Consent   Rachel Garrett, you are scheduled for a virtual visit with a [facility] provider today. Just as with appointments in the office, your consent must be obtained to participate. Your consent will be active for this visit and any virtual visit you may have with one of our providers in the next 365 days. If you have a MyChart account, a copy of this consent can be sent to you electronically.  As this is a virtual visit, video technology does not allow for your provider to perform a traditional examination. This may limit your provider's ability to fully assess your condition. If your provider identifies any concerns that need to be evaluated in person or the need to arrange testing (such as labs, EKG, etc.), we will make arrangements to do so. Although advances in technology are sophisticated, we cannot ensure that it will always work on either your end or our end. If the connection with a video visit is poor, the visit may have to be switched to a telephone visit. With either a video or telephone visit, we are not always able to ensure that we have a secure connection.  By engaging in this virtual visit, you consent to the provision of healthcare and authorize for your insurance to be billed (if applicable) for the services provided during this visit. Depending on your insurance coverage, you may receive a charge related to this service.  I need to obtain your verbal consent now. Are you willing to proceed with your visit today? TENIYA MATRAS has provided verbal consent on 04/17/2022 for a virtual visit (video or telephone). Apolonio Schneiders, FNP  Date: 04/17/2022 7:37 PM  Virtual Visit via Video Note   I, Apolonio Schneiders, connected with  RAFIA RAZZAK  ([id number], [birth year]) on 04/17/22 at  7:45 PM EDT by a video-enabled telemedicine application and verified that I am speaking with the correct person using two identifiers.  Location: Patient: Virtual Visit Location  Patient: Home Provider: Virtual Visit Location Provider: Home Office   I discussed the limitations of evaluation and management by telemedicine and the availability of in person appointments. The patient expressed understanding and agreed to proceed.    History of Present Illness: Rachel Garrett is a 37 y.o. who identifies as a female who was assigned female at birth, and is being seen today for muscle strain in neck/shoulder.  She has injured her neck and shoulder in the past had PT for relief  She moved into an apartment over the weekend and was lifting a lot of boxes and yesterday when she woke up she was sore from her neck down her left shoulder and into arm   She denies any numbness or tingling   She has been using extra strength tylenol and 600 ibuprofen for relief   Has taken Flexeril in the past for muscle strains   Problems:  Patient Active Problem List   Diagnosis Date Noted   Neck pain 04/17/2022   Anxiety 10/24/2021   Moderate episode of recurrent major depressive disorder ([place]) 10/24/2021   Chronic pelvic pain in female 06/14/2021   Intramural, submucous, and subserous leiomyoma of uterus [id number]   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   Foraminal stenosis of lumbar region 10/17/2019   Acute right-sided low back pain with right-sided sciatica 07/15/2019   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   Carpal tunnel syndrome 09/16/2018   Endometriosis determined by laparoscopy 02/19/2017   S/P myomectomy 11/02/2016   Right  ovarian cyst 08/07/2016    Allergies:  Allergies  Allergen Reactions   Bee Venom Swelling and Itching    Allergic to bees   Chocolate Flavor Other (See Comments)    Causes migraine   Iodinated Contrast Media Hives   Other Itching and Swelling    Allergic to bees   Benadryl [Diphenhydramine Hcl (Sleep)] Swelling   Chocolate     migraines   Cocoa     migraines   Estrogens     Increases chances of having stroke    Lactose Intolerance (Gi) Diarrhea and Nausea And Vomiting   Latex Itching and Swelling   Ultram [Tramadol Hcl] Itching   Iodine Itching   Medications:  Current Outpatient Medications:    albuterol (PROVENTIL) (2.5 MG/3ML) 0.083% nebulizer solution, Take 3 mLs (2.5 mg total) by nebulization every 6 (six) hours as needed for wheezing or shortness of breath., Disp: 150 mL, Rfl: 1   clindamycin (CLEOCIN T) 1 % external solution, APPLY TOPICALLY TO AFFECTED AREAS TWICE A DAY, Disp: 60 mL, Rfl: 5   cyclobenzaprine (FLEXERIL) 10 MG tablet, Take 10 mg by mouth at bedtime., Disp: , Rfl:    gabapentin (NEURONTIN) 300 MG capsule, Take 3 pills p.o. qHS, Disp: 90 capsule, Rfl: 5   gabapentin (NEURONTIN) 300 MG capsule, Take 300 mg by mouth 3 (three) times daily., Disp: , Rfl:    hydrochlorothiazide (MICROZIDE) 12.5 MG capsule, Take 1 capsule by mouth once a day TAKE 1 CAPSULE BY MOUTH ONCE A DAY, Disp: 90 capsule, Rfl: 3   meloxicam (MOBIC) 15 MG tablet, Take 1 tablet by mouth once a day with food for inflammation, Disp: 60 tablet, Rfl: 2   methocarbamol (ROBAXIN) 500 MG tablet, Take 1 tablet by mouth every six hours as needed for muscle tension/spasms DAYTIME, Disp: 60 tablet, Rfl: 1   norethindrone (AYGESTIN) 5 MG tablet, Take 1 tablet (5 mg total) by mouth daily., Disp: 30 tablet, Rfl: 11   rizatriptan (MAXALT) 10 MG tablet, Take 10 mg by mouth every 2 (two) hours as needed for migraine. , Disp: , Rfl: 0   topiramate (TOPAMAX) 100 MG tablet, Take 1 tablet (100 mg total) by mouth once daily for 360 days, Disp: 90 tablet, Rfl: 3   topiramate (TOPAMAX) 50 MG tablet, TAKE 1 TABLET BY MOUTH TWICE A DAY, Disp: 180 tablet, Rfl: 3   traZODone (DESYREL) 50 MG tablet, TAKE 1 TABLET BY MOUTH AT BEDTIME AS NEEDED FOR SLEEP, Disp: 30 tablet, Rfl: 4   valACYclovir (VALTREX) 1000 MG tablet, TAKE 1 TABLET BY MOUTH ONCE DAILY, Disp: 90 tablet, Rfl: 3   varenicline (CHANTIX) 0.5 MG tablet, Take 1 tablet (0.'5mg'$ ) once daily  for 3 days, then 1 tablet (0.'5mg'$ ) twice a day for 4 days, the 2 tablets ('1mg'$ ) twice a day. Then start '1mg'$  tablet twice daily., Disp: 56 tablet, Rfl: 0  Observations/Objective: Patient is well-developed, well-nourished in no acute distress.  Resting comfortably  at home.  Head is normocephalic, atraumatic.  No labored breathing.  Speech is clear and coherent with logical content.  Patient is alert and oriented at baseline.    Assessment and Plan: 1. Muscle strain  - cyclobenzaprine (FLEXERIL) 10 MG tablet; Take 1 tablet (10 mg total) by mouth at bedtime for 10 days.  Dispense: 10 tablet; Refill: 0    Discussed if pain is not controlled with tylenol and NSAIDs she is taking she will need to visit UC for pain assessment and Rx as we cannot prescribe  controlled Rx through Virtual at this time   May apply heat as needed limit to 10-15 minutes each hour for heat application  Passive ROM   Follow Up Instructions: I discussed the assessment and treatment plan with the patient. The patient was provided an opportunity to ask questions and all were answered. The patient agreed with the plan and demonstrated an understanding of the instructions.  A copy of instructions were sent to the patient via MyChart unless otherwise noted below.    The patient was advised to call back or seek an in-person evaluation if the symptoms worsen or if the condition fails to improve as anticipated.  Time:  I spent 20 minutes with the patient via telehealth technology discussing the above problems/concerns.    Apolonio Schneiders, FNP

## 2022-05-20 ENCOUNTER — Telehealth: Payer: Self-pay | Admitting: Nurse Practitioner

## 2022-05-20 NOTE — Progress Notes (Signed)
No show- called all 3 numbers listed on chart and there wasno answer and not able to leave message.Rachel Pierini, FNP

## 2022-12-23 ENCOUNTER — Other Ambulatory Visit: Payer: Self-pay | Admitting: Nurse Practitioner

## 2022-12-23 DIAGNOSIS — T148XXA Other injury of unspecified body region, initial encounter: Secondary | ICD-10-CM
